# Patient Record
Sex: Female | Born: 1970
Health system: Southern US, Community
[De-identification: ages and names within clinical notes are randomized; demographics above are authoritative.]

## PROBLEM LIST (undated history)

## (undated) ENCOUNTER — Inpatient Hospital Stay (HOSPITAL_COMMUNITY): Admission: AD | Payer: 59 | Source: Ambulatory Visit | Admitting: Gynecology

## (undated) DIAGNOSIS — I10 Essential (primary) hypertension: Secondary | ICD-10-CM

## (undated) DIAGNOSIS — M5020 Other cervical disc displacement, unspecified cervical region: Secondary | ICD-10-CM

## (undated) HISTORY — PX: TUBAL LIGATION: SHX77

---

## 2003-12-03 ENCOUNTER — Emergency Department (HOSPITAL_COMMUNITY): Admission: EM | Admit: 2003-12-03 | Discharge: 2003-12-03 | Payer: Self-pay | Admitting: Emergency Medicine

## 2004-10-30 ENCOUNTER — Emergency Department (HOSPITAL_COMMUNITY): Admission: EM | Admit: 2004-10-30 | Discharge: 2004-10-30 | Payer: Self-pay | Admitting: Emergency Medicine

## 2005-01-31 ENCOUNTER — Emergency Department (HOSPITAL_COMMUNITY): Admission: EM | Admit: 2005-01-31 | Discharge: 2005-01-31 | Payer: Self-pay | Admitting: Family Medicine

## 2005-09-19 ENCOUNTER — Emergency Department (HOSPITAL_COMMUNITY): Admission: EM | Admit: 2005-09-19 | Discharge: 2005-09-19 | Payer: Self-pay | Admitting: *Deleted

## 2006-07-14 ENCOUNTER — Emergency Department (HOSPITAL_COMMUNITY): Admission: EM | Admit: 2006-07-14 | Discharge: 2006-07-14 | Payer: Self-pay | Admitting: Emergency Medicine

## 2007-12-04 ENCOUNTER — Emergency Department (HOSPITAL_COMMUNITY): Admission: EM | Admit: 2007-12-04 | Discharge: 2007-12-04 | Payer: Self-pay | Admitting: Emergency Medicine

## 2009-04-20 DIAGNOSIS — M5020 Other cervical disc displacement, unspecified cervical region: Secondary | ICD-10-CM

## 2009-04-20 HISTORY — DX: Other cervical disc displacement, unspecified cervical region: M50.20

## 2009-10-08 ENCOUNTER — Observation Stay (HOSPITAL_COMMUNITY): Admission: EM | Admit: 2009-10-08 | Discharge: 2009-10-09 | Payer: Self-pay | Admitting: Emergency Medicine

## 2009-10-09 ENCOUNTER — Encounter (INDEPENDENT_AMBULATORY_CARE_PROVIDER_SITE_OTHER): Payer: Self-pay | Admitting: Internal Medicine

## 2009-10-09 ENCOUNTER — Ambulatory Visit: Payer: Self-pay | Admitting: Vascular Surgery

## 2009-10-11 ENCOUNTER — Ambulatory Visit: Payer: Self-pay | Admitting: Internal Medicine

## 2009-10-11 DIAGNOSIS — M5020 Other cervical disc displacement, unspecified cervical region: Secondary | ICD-10-CM | POA: Insufficient documentation

## 2009-10-11 DIAGNOSIS — M5412 Radiculopathy, cervical region: Secondary | ICD-10-CM | POA: Insufficient documentation

## 2009-10-11 DIAGNOSIS — M542 Cervicalgia: Secondary | ICD-10-CM

## 2009-10-17 ENCOUNTER — Telehealth: Payer: Self-pay | Admitting: Internal Medicine

## 2009-12-05 ENCOUNTER — Encounter
Admission: RE | Admit: 2009-12-05 | Discharge: 2010-03-05 | Payer: Self-pay | Admitting: Physical Medicine & Rehabilitation

## 2009-12-10 ENCOUNTER — Encounter: Payer: Self-pay | Admitting: Internal Medicine

## 2009-12-10 ENCOUNTER — Other Ambulatory Visit: Admission: RE | Admit: 2009-12-10 | Discharge: 2009-12-10 | Payer: Self-pay | Admitting: Internal Medicine

## 2009-12-10 ENCOUNTER — Ambulatory Visit: Payer: Self-pay | Admitting: Internal Medicine

## 2009-12-10 DIAGNOSIS — R030 Elevated blood-pressure reading, without diagnosis of hypertension: Secondary | ICD-10-CM

## 2009-12-10 LAB — CONVERTED CEMR LAB
ALT: 15 units/L (ref 0–35)
Alkaline Phosphatase: 79 units/L (ref 39–117)
Basophils Absolute: 0.1 10*3/uL (ref 0.0–0.1)
Bilirubin Urine: NEGATIVE
Bilirubin, Direct: 0.1 mg/dL (ref 0.0–0.3)
CO2: 29 meq/L (ref 19–32)
Chloride: 103 meq/L (ref 96–112)
Cholesterol: 171 mg/dL (ref 0–200)
Eosinophils Absolute: 0.1 10*3/uL (ref 0.0–0.7)
GFR calc non Af Amer: 122.49 mL/min (ref >60)
Glucose, Bld: 91 mg/dL (ref 70–99)
HCT: 39.6 % (ref 36.0–46.0)
HDL: 54.6 mg/dL (ref >39.00)
Ketones, ur: NEGATIVE mg/dL
LDL Cholesterol: 102 mg/dL — ABNORMAL HIGH (ref 0–99)
Lymphocytes Relative: 27.6 % (ref 12.0–46.0)
Lymphs Abs: 2 10*3/uL (ref 0.7–4.0)
MCV: 91 fL (ref 78.0–100.0)
Monocytes Absolute: 0.6 10*3/uL (ref 0.1–1.0)
Neutro Abs: 4.6 10*3/uL (ref 1.4–7.7)
Platelets: 305 10*3/uL (ref 150.0–400.0)
Specific Gravity, Urine: >=1.03 (ref 1.000–1.030)
Total Bilirubin: 0.4 mg/dL (ref 0.3–1.2)
Total CHOL/HDL Ratio: 3
Total Protein, Urine: NEGATIVE mg/dL
Total Protein: 7.1 g/dL (ref 6.0–8.3)
Triglycerides: 71 mg/dL (ref 0.0–149.0)
Urine Glucose: NEGATIVE mg/dL
VLDL: 14.2 mg/dL (ref 0.0–40.0)

## 2009-12-11 ENCOUNTER — Encounter: Payer: Self-pay | Admitting: Internal Medicine

## 2009-12-12 ENCOUNTER — Encounter: Payer: Self-pay | Admitting: Internal Medicine

## 2009-12-13 ENCOUNTER — Ambulatory Visit: Payer: Self-pay | Admitting: Physical Medicine & Rehabilitation

## 2009-12-18 ENCOUNTER — Encounter
Admission: RE | Admit: 2009-12-18 | Discharge: 2010-02-05 | Payer: Self-pay | Admitting: Physical Medicine & Rehabilitation

## 2010-05-21 NOTE — Letter (Signed)
Summary: Results Follow-up Letter  [facility]  [address] [place], [place] [address]   Phone: [phone]  Fax: [phone]    12/12/2009  [address] [place], [place]  [address]  Dear Ms. Jasmine Franklin,   The following are the results of your recent test(s):  Test     Result     Pap Smear    Normal____xxx___  Not Normal_____       Comments:fungal infection _________________________________________________________ Cholesterol LDL(Bad cholesterol):          Your goal is less than:         HDL (Good cholesterol):        Your goal is more than: _________________________________________________________ Other Tests:   _________________________________________________________  Please call for an appointment in 2-3 weeks _________________________________________________________ _________________________________________________________ _________________________________________________________  Sincerely,  Sanda Linger MD [facility]

## 2010-05-21 NOTE — Assessment & Plan Note (Signed)
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  O2 Flow:  Room air CC: Hospital follow up// Lside arm w/ neck pain and stiffness, Neck pain Is Patient Diabetic? No   Primary Care Provider:  Etta Grandchild MD  CC:  Hospital follow up// Lside arm w/ neck pain and stiffness and Neck pain.  History of Present Illness:  Neck Pain      This is a 40 year old woman who presents with Neck pain.  The problem began 2 months ago.  The intensity is described as moderate-severe.  The patient reports left neck pain, but denies right neck pain, midline neck pain, bilateral neck pain, and left shoulder pain.  Associated symptoms include numbness, weakness, and tingling/parasthesias.  The patient denies the following associated symptoms: impaired coordination, gait disturbance, fever, bladder dysfunction, bowel dysfunction, locking, clicking, and impaired neck ROM.  The pain is described as sharp, intermittent, and radiates to the left arm.  Evaluation to date has included MRI of neck that showed HNP at C4-C6 with disc material that extends into the neural foramen.  Aleve has not helped the pain.  Preventive Screening-Counseling & Management  Alcohol-Tobacco     Smoking Status: never  Caffeine-Diet-Exercise     Does Patient Exercise: no      Drug Use:  no.    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  Physical Exam  General:  alert, well-developed, well-nourished, well-hydrated, appropriate dress, normal appearance, healthy-appearing, cooperative to examination, good hygiene, and overweight-appearing.   Head:  normocephalic, atraumatic, no abnormalities observed, and no abnormalities palpated.   Eyes:  vision grossly intact, pupils react to accomodation, and no nystagmus.   Mouth:  Oral mucosa and oropharynx without lesions or exudates.  Teeth in good repair. Neck:  supple, no masses, no thyromegaly, no thyroid nodules or tenderness, no JVD, no HJR, normal carotid upstroke, no carotid bruits, no cervical lymphadenopathy, and no neck tenderness.   Lungs:  normal respiratory effort, no intercostal retractions, no accessory muscle use, normal breath sounds, no dullness, and no fremitus.   Heart:  normal rate, regular rhythm, no murmur, no gallop, no rub, and no JVD.   Abdomen:  soft, non-tender, normal bowel sounds, no distention, no masses, no guarding, no rigidity, no rebound tenderness, no abdominal hernia, no inguinal hernia, no  hepatomegaly, and no splenomegaly.   Msk:  normal ROM, no joint tenderness, no joint swelling, no joint warmth, no redness over joints, no joint deformities, no joint instability, and no crepitation.   Pulses:  R and L carotid,radial,femoral,dorsalis pedis and posterior tibial pulses are full and equal bilaterally Extremities:  No clubbing, cyanosis, edema, or deformity  noted with normal full range of motion of all joints.   Neurologic:  alert & oriented X3, cranial nerves II-XII intact, sensation intact to light touch, sensation intact to pinprick, gait normal, DTRs symmetrical and normal, finger-to-nose normal, heel-to-shin normal, and LUE weakness.     Detailed Back/Spine Exam  General:    obese.    Gait:    Normal heel-toe gait pattern bilaterally.    Skin:    Intact with no erythema; no scarring.    Cervical Exam:  Inspection-deformity:    Normal Palpation-spinal tenderness:  Normal Range of Motion:    Forward Flexion:   55 degrees    Hyperextension:   80 degrees    Right Lat. Flexion:   55 degrees    Left Lat. Flexion:   15 degrees    Right Lat. Rotation:   85 degrees    Left Lat. Rotation:   45 degrees Spurling Maneuver:    negative   Impression & Recommendations:  Problem # 1:  HERNIATED CERVICAL DISC (ICD-722.0) Assessment New I think she would benefit from [facility] will try depo-medrol IM for inflammation around the disc herniation  Orders: Pain Clinic Referral (Pain)  Problem # 2:  NECK PAIN, LEFT (ICD-723.1) Assessment: New  Her updated medication list for this problem includes:    Amrix 15 Mg Xr24h-cap (Cyclobenzaprine hcl) ..... One by mouth once daily for neck pain    Tramadol Hcl 50 Mg Tabs (Tramadol hcl) .Marland Kitchen... 1-2 by mouth qid as needed for pain  Orders: Pain Clinic Referral (Pain)  Problem # 3:  CERVICAL RADICULOPATHY, LEFT (ICD-723.4) Assessment: New  Orders: Pain Clinic Referral (Pain)  Complete Medication List: 1)  Amrix 15 Mg Xr24h-cap (Cyclobenzaprine hcl) .... One by mouth once daily for neck pain 2)  Tramadol Hcl 50 Mg Tabs (Tramadol hcl) .Marland Kitchen.. 1-2 by mouth qid as needed for pain  Patient Instructions: 1)  Please schedule a follow-up appointment in 2 months. Prescriptions: TRAMADOL HCL 50 MG TABS (TRAMADOL HCL) 1-2 by mouth QID as needed for pain  #50 x 2   Entered and Authorized by:   Etta Grandchild MD   Signed by:   Etta Grandchild MD on 10/11/2009   Method used:   Print then Give to Patient   RxID:   [id number] AMRIX 15 MG XR24H-CAP (CYCLOBENZAPRINE HCL) One by mouth once daily for neck pain  #10 x 0   Entered and Authorized by:   Etta Grandchild MD   Signed by:   Etta Grandchild MD on 10/11/2009   Method used:   Samples Given   RxID:   [id number]   Preventive Care Screening  Last Tetanus Booster:    Date:  04/20/2004    Results:  Historical

## 2010-05-21 NOTE — Letter (Signed)
Summary: Out of Work  [facility]  [address] [place], [place] [address]   Phone: [phone]  Fax: [phone]      October 11, 2009   Employee:  ARMENIA SILVERIA    To Whom It May Concern:   For Medical reasons, please excuse the above named employee from work for the following dates:  Start:   10/09/09  End:   10/14/09  If you need additional information, please feel free to contact our office.         Sincerely,    Etta Grandchild MD

## 2010-05-21 NOTE — Letter (Signed)
Summary: Lipid Letter  [facility]  [address] [place], [place] [address]   Phone: [phone]  Fax: [phone]    12/11/2009  Jasmine Franklin [address] [place], [place]  [address]  Dear Jasmine Franklin:  We have carefully reviewed your last lipid profile from 12/10/2009 and the results are noted below with a summary of recommendations for lipid management.    Cholesterol:       171     Goal: <200   HDL "good" Cholesterol:   57.84     Goal: >50   LDL "bad" Cholesterol:   102     Goal: <130   Triglycerides:       71.0     Goal: <150    your cholesterol levels are very good and your other labs are normal    TLC Diet (Therapeutic Lifestyle Change): Saturated Fats & Transfatty acids should be kept < 7% of total calories ***Reduce Saturated Fats Polyunstaurated Fat can be up to 10% of total calories Monounsaturated Fat Fat can be up to 20% of total calories Total Fat should be no greater than 25-35% of total calories Carbohydrates should be 50-60% of total calories Protein should be approximately 15% of total calories Fiber should be at least 20-30 grams a day ***Increased fiber may help lower LDL Total Cholesterol should be < 200mg /day Consider adding plant stanol/sterols to diet (example: Benacol spread) ***A higher intake of unsaturated fat may reduce Triglycerides and Increase HDL    Adjunctive Measures (may lower LIPIDS and reduce risk of Heart Attack) include: Aerobic Exercise (20-30 minutes 3-4 times a week) Limit Alcohol Consumption Weight Reduction Aspirin 75-81 mg a day by mouth (if not allergic or contraindicated) Dietary Fiber 20-30 grams a day by mouth     Current Medications: 1)    Amrix 15 Mg Xr24h-cap (Cyclobenzaprine hcl) .... One by mouth once daily for neck pain 2)    Butrans 5 Mcg/hr Ptwk (Buprenorphine) .... Apply one each week  If you have any questions, please call. We appreciate being able to work with you.   Sincerely,    [facility] Etta Grandchild MD

## 2010-05-21 NOTE — Assessment & Plan Note (Signed)
Summary: 2 mo fu-oyu   Vital Signs:  Patient profile:   40 year old female Height:      67 inches Weight:      210 pounds BMI:     33.01 O2 Sat:      99 % on Room air Temp:     98.3 degrees F oral Pulse rate:   90 / minute Pulse rhythm:   regular Resp:     16 per minute BP sitting:   140 / 90  (left arm) Cuff size:   large  Vitals Entered By: Rock Nephew CMA (December 10, 2009 9:19 AM)  Nutrition Counseling: Patient's BMI is greater than 25 and therefore counseled on weight management options.  O2 Flow:  Room air CC: CPX with labs, Preventive Care Is Patient Diabetic? No Pain Assessment Patient in pain? no       Does patient need assistance? Functional Status Self care Ambulation Normal   Primary Care Provider:  Etta Grandchild MD  CC:  CPX with labs and Preventive Care.  History of Present Illness: She returns for f/up and she requests a complete physical. Her neck and left arm pain is much better. She has stopped using Amrix and Butrans but she plans to keep her appt. with Pain Mngt. this Friday.  Preventive Screening-Counseling & Management  Alcohol-Tobacco     Alcohol drinks/day: 0     Smoking Status: never  Caffeine-Diet-Exercise     Does Patient Exercise: yes     Exercise Counseling: to improve exercise regimen  Hep-HIV-STD-Contraception     Hepatitis Risk: no risk noted     HIV Risk: no risk noted     STD Risk: no risk noted     Dental Visit-last 6 months yes     Dental Care Counseling: to seek dental care; no dental care within six months     SBE monthly: yes     SBE Education/Counseling: to perform regular SBE  Safety-Violence-Falls     Seat Belt Use: yes     Helmet Use: yes     Firearms in the Home: no firearms in the home     Smoke Detectors: yes     Violence in the Home: no risk noted     Sexual Abuse: no      Sexual History:  currently monogamous.        Drug Use:  no.        Blood Transfusions:  no.    Current Medications  (verified): 1)  Amrix 15 Mg Xr24h-Cap (Cyclobenzaprine Hcl) .... One By Mouth Once Daily For Neck Pain 2)  Butrans 5 Mcg/hr Ptwk (Buprenorphine) .... 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 Unremarkable  Past Surgical History: Tubal ligation  Family History: Reviewed history from 10/11/2009 and no changes required. Family History Hypertension  Social History: Reviewed history from 10/11/2009 and no changes required. 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  The patient denies anorexia, fever, chest pain, syncope, dyspnea on exertion, peripheral edema, prolonged cough, headaches, hemoptysis, abdominal pain, melena, hematochezia, severe indigestion/heartburn, hematuria, suspicious skin lesions, difficulty walking, depression, enlarged lymph nodes, angioedema, and breast masses.    Physical Exam  General:  alert, well-developed, well-nourished, well-hydrated, appropriate dress, healthy-appearing, cooperative to examination, good hygiene, and overweight-appearing.   Head:  normocephalic, atraumatic, no abnormalities observed, and no abnormalities palpated.   Eyes:  vision grossly intact, pupils equal,  pupils round, pupils reactive to light, no injection, and no retinal abnormalitiies.   Ears:  R ear normal and L ear normal.   Mouth:  Oral mucosa and oropharynx without lesions or exudates.  Teeth in good repair. Neck:  supple, full ROM, no masses, no thyromegaly, no thyroid nodules or tenderness, no JVD, normal carotid upstroke, no carotid bruits, no cervical lymphadenopathy, and no neck tenderness.   Chest Wall:  no deformities, no tenderness, and no mass.   Breasts:  skin/areolae normal, no masses, no abnormal thickening, no nipple discharge, no tenderness, and no adenopathy.   Lungs:  normal respiratory effort, no intercostal retractions, no accessory muscle use, normal breath sounds, no dullness, no fremitus, no crackles, and no wheezes.   Heart:  normal rate, regular rhythm, no murmur, no gallop, no rub, and no JVD.   Abdomen:  soft, non-tender, normal bowel sounds, no distention, no masses, no guarding, no rigidity, no rebound tenderness, no abdominal hernia, no inguinal hernia, no hepatomegaly, and no splenomegaly.   Rectal:  No external abnormalities noted. Normal sphincter tone. No rectal masses or tenderness. Genitalia:  Normal introitus for age, no external lesions, no vaginal discharge, mucosa pink and moist, no vaginal or cervical lesions, no vaginal atrophy, no friaility or hemorrhage, normal uterus size and position, no adnexal masses or tenderness Msk:  No deformity or scoliosis noted of thoracic or lumbar spine.   Pulses:  R and L carotid,radial,femoral,dorsalis pedis and posterior tibial pulses are full and equal bilaterally Extremities:  No clubbing, cyanosis, edema, or deformity noted with normal full range of motion of all joints.   Neurologic:  No cranial nerve deficits noted. Station and gait are normal. Plantar reflexes are down-going bilaterally. DTRs are symmetrical throughout. Sensory, motor and coordinative functions appear intact. Skin:  turgor normal, color normal, no  rashes, no suspicious lesions, no ecchymoses, no petechiae, no purpura, no ulcerations, no edema, and tattoo(s).   Cervical Nodes:  no anterior cervical adenopathy and no posterior cervical adenopathy.   Axillary Nodes:  no R axillary adenopathy and no L axillary adenopathy.   Inguinal Nodes:  no R inguinal adenopathy and no L inguinal adenopathy.   Psych:  Cognition and judgment appear intact. Alert and cooperative with normal attention span and concentration. No apparent delusions, illusions, hallucinations   Impression & Recommendations:  Problem # 1:  ELEVATED BLOOD PRESSURE WITHOUT DIAGNOSIS OF HYPERTENSION (ICD-796.2) Assessment New  Orders: TLB-Lipid Panel (80061-LIPID) TLB-BMP (Basic Metabolic Panel-BMET) (80048-METABOL) TLB-CBC Platelet - w/Differential (85025-CBCD) TLB-Hepatic/Liver Function Pnl (80076-HEPATIC) TLB-TSH (Thyroid Stimulating Hormone) (84443-TSH) TLB-Udip w/ Micro (81001-URINE)  BP today: 140/90 Prior BP: 136/90 (10/11/2009)  Instructed in low sodium diet (DASH Handout) and behavior modification.    Problem # 2:  ROUTINE GENERAL MEDICAL EXAM@HEALTH  CARE FACL (ICD-V70.0) Assessment: New  Orders: TLB-Lipid Panel (80061-LIPID) TLB-BMP (Basic Metabolic Panel-BMET) (80048-METABOL) TLB-CBC Platelet -  w/Differential (85025-CBCD) TLB-Hepatic/Liver Function Pnl (80076-HEPATIC) TLB-TSH (Thyroid Stimulating Hormone) (84443-TSH) TLB-Udip w/ Micro (81001-URINE) Radiology Referral (Radiology)  Td Booster: Historical (04/20/2004)    Discussed using sunscreen, use of alcohol, drug use, self breast exam, routine dental care, routine eye care, schedule for GYN exam, routine physical exam, seat belts, multiple vitamins, osteoporosis prevention, adequate calcium intake in diet, recommendations for immunizations, mammograms and Pap smears.  Discussed exercise and checking cholesterol.  Discussed gun safety, safe sex, and contraception.  Problem # 3:  HERNIATED CERVICAL DISC  (ICD-722.0) Assessment: Improved  Complete Medication List: 1)  Amrix 15 Mg Xr24h-cap (Cyclobenzaprine hcl) .... One by mouth once daily for neck pain 2)  Butrans 5 Mcg/hr Ptwk (Buprenorphine) .... Apply one each week  Colorectal Screening:  Current Recommendations:    Hemoccult: NEG X 1 today  PAP Screening:    Hx Cervical Dysplasia in last 5 yrs? No    3 normal PAP smears in last 5 yrs? Yes    Reviewed PAP smear recommendations:  PAP smear done  Mammogram Screening:    Reviewed Mammogram recommendations:  mammogram ordered  Osteoporosis Risk Assessment:  Risk Factors for Fracture or Low Bone Density:   Smoking status:       never  Immunization & Chemoprophylaxis:    Tetanus vaccine: Historical  (04/20/2004)  Patient Instructions: 1)  It is important that you exercise regularly at least 20 minutes 5 times a week. If you develop chest pain, have severe difficulty breathing, or feel very tired , stop exercising immediately and seek medical attention. 2)  You need to lose weight. Consider a lower calorie diet and regular exercise.  3)  Schedule your mammogram. 4)  You need to have a Pap Smear to prevent cervical cancer. 5)  If you could be exposed to sexually transmitted diseases, you should use a condom. 6)  If you are having sex and you or your partner don't want a child, use contraception. 7)  Check your Blood Pressure regularly. If it is above 140/90: you should make an appointment.

## 2010-05-21 NOTE — Progress Notes (Signed)
Summary: Pain  Phone Note Call from Patient Call back at [facility] Phone [phone]   Summary of Call: Pt was given amrix and tramadol. Tramadol causes her to be sleepy. Patient is requesting rx to take during the day while at work. C/o pain and tingling while at work. Any other suggestions while she is waiting on pain clinic apt 8/26?  Initial call taken by: Lamar Sprinkles, CMA,  October 17, 2009 2:49 PM  Follow-up for Phone Call        Can use amrix and tramadol along w/this med?  Follow-up by: Lamar Sprinkles, CMA,  October 17, 2009 3:07 PM  Additional Follow-up for Phone Call Additional follow up Details #1::        no,  stop tramadol Additional Follow-up by: Etta Grandchild MD,  October 17, 2009 3:09 PM    Additional Follow-up for Phone Call Additional follow up Details #2::    left mess to call office back. Rx is up front...............Marland KitchenLamar Sprinkles, CMA  October 17, 2009 5:07 PM   Additional Follow-up for Phone Call Additional follow up Details #3:: Details for Additional Follow-up Action Taken: Pt informed  Additional Follow-up by: Lamar Sprinkles, CMA,  October 18, 2009 9:50 AM  New/Updated Medications: BUTRANS 5 MCG/HR PTWK (BUPRENORPHINE) Apply one each week Prescriptions: AMRIX 15 MG XR24H-CAP (CYCLOBENZAPRINE HCL) One by mouth once daily for neck pain  #30 x 0   Entered by:   Lamar Sprinkles, CMA   Authorized by:   Etta Grandchild MD   Signed by:   Lamar Sprinkles, CMA on 10/18/2009   Method used:   Electronically to        [facility] [address] [phone]* (retail)       [address]       [place], [place]  [address]       Ph: [phone] or [phone]       Fax: [phone]   RxID:   [id number] BUTRANS 5 MCG/HR PTWK (BUPRENORPHINE) Apply one each week  #4 x 3   Entered and Authorized by:   Etta Grandchild MD   Signed by:   Etta Grandchild MD on 10/17/2009   Method used:   Print then Give to Patient   RxID:   [id number]

## 2010-07-06 LAB — POCT I-STAT, CHEM 8
Calcium, Ion: 1.16 mmol/L (ref 1.12–1.32)
Chloride: 103 mEq/L (ref 96–112)
Creatinine, Ser: 0.9 mg/dL (ref 0.4–1.2)
HCT: 40 % (ref 36.0–46.0)
Potassium: 3.6 mEq/L (ref 3.5–5.1)
Sodium: 139 mEq/L (ref 135–145)
TCO2: 25 mmol/L (ref 0–100)

## 2010-07-06 LAB — COMPREHENSIVE METABOLIC PANEL
ALT: 15 U/L (ref 0–35)
AST: 19 U/L (ref 0–37)
Albumin: 3.1 g/dL — ABNORMAL LOW (ref 3.5–5.2)
CO2: 27 mEq/L (ref 19–32)
Chloride: 106 mEq/L (ref 96–112)
GFR calc non Af Amer: >60 mL/min (ref >60)
Potassium: 3.6 mEq/L (ref 3.5–5.1)
Total Bilirubin: 0.5 mg/dL (ref 0.3–1.2)
Total Protein: 6.7 g/dL (ref 6.0–8.3)

## 2010-07-06 LAB — CBC
HCT: 37.3 % (ref 36.0–46.0)
HCT: 38 % (ref 36.0–46.0)
Hemoglobin: 12.3 g/dL (ref 12.0–15.0)
Hemoglobin: 12.5 g/dL (ref 12.0–15.0)
MCV: 90.2 fL (ref 78.0–100.0)
Platelets: 255 10*3/uL (ref 150–400)
RDW: 12.6 % (ref 11.5–15.5)
WBC: 7.4 10*3/uL (ref 4.0–10.5)
WBC: 7.7 10*3/uL (ref 4.0–10.5)

## 2010-07-06 LAB — DIFFERENTIAL
Basophils Relative: 0 % (ref 0–1)
Lymphocytes Relative: 36 % (ref 12–46)
Lymphs Abs: 2.7 10*3/uL (ref 0.7–4.0)
Neutro Abs: 4.1 10*3/uL (ref 1.7–7.7)
Neutrophils Relative %: 55 % (ref 43–77)

## 2010-07-06 LAB — LIPID PANEL
Cholesterol: 144 mg/dL (ref 0–200)
HDL: 46 mg/dL (ref >39)
Total CHOL/HDL Ratio: 3.1 RATIO
VLDL: 11 mg/dL (ref 0–40)

## 2010-07-06 LAB — HOMOCYSTEINE: Homocysteine: 7.8 umol/L (ref 4.0–15.4)

## 2010-07-06 LAB — CK TOTAL AND CKMB (NOT AT ARMC)
CK, MB: 0.5 ng/mL (ref 0.3–4.0)
Relative Index: INVALID (ref 0.0–2.5)
Total CK: 83 U/L (ref 7–177)

## 2010-07-06 LAB — PROTIME-INR: Prothrombin Time: 15.4 seconds — ABNORMAL HIGH (ref 11.6–15.2)

## 2011-11-12 ENCOUNTER — Emergency Department (INDEPENDENT_AMBULATORY_CARE_PROVIDER_SITE_OTHER)
Admission: EM | Admit: 2011-11-12 | Discharge: 2011-11-12 | Disposition: A | Discharge status: Home / Self Care | Payer: 59 | Source: Home / Self Care

## 2011-11-12 ENCOUNTER — Encounter (HOSPITAL_COMMUNITY): Payer: Self-pay | Admitting: *Deleted

## 2011-11-12 DIAGNOSIS — R030 Elevated blood-pressure reading, without diagnosis of hypertension: Secondary | ICD-10-CM

## 2011-11-12 DIAGNOSIS — R51 Headache: Secondary | ICD-10-CM

## 2011-11-12 MED ORDER — HYDROCHLOROTHIAZIDE 25 MG PO TABS: 25.0000 mg | ORAL_TABLET | Freq: Every day | ORAL | Status: DC

## 2011-11-12 MED ORDER — ONDANSETRON HCL 4 MG PO TABS
4.0000 mg | ORAL_TABLET | Freq: Once | ORAL | Status: AC
  Administered 2011-11-12: 4 mg via ORAL

## 2011-11-12 MED ORDER — ONDANSETRON 4 MG PO TBDP
ORAL_TABLET | ORAL | Status: AC
  Filled 2011-11-12: qty 1

## 2011-11-12 MED ORDER — KETOROLAC TROMETHAMINE 30 MG/ML IJ SOLN: 30.0000 mg | Freq: Once | INTRAMUSCULAR | Status: DC

## 2011-11-12 MED ORDER — KETOROLAC TROMETHAMINE 30 MG/ML IJ SOLN
INTRAMUSCULAR | Status: AC
  Filled 2011-11-12: qty 1

## 2011-11-12 NOTE — ED Provider Notes (Signed)
Medical screening examination/treatment/procedure(s) were performed by resident-physician practitioner and as supervising physician I was immediately available for consultation/collaboration. I have examined the patient and review discharge instructions with the patient. I have examined the patient, and discussed her discharge plan. Patient feels much improved and has no focal neurological findings. Patient was instructed about symptoms that would warrant further evaluation in the emergency department.  Raynald Blend, MD 11/12/11 2147

## 2011-11-12 NOTE — ED Notes (Signed)
Pt  Reports  Has  A  Headache   With  Nausea  And   Photophobia      -  She  Reports  Had  Headache  yest  -  Worse  Today  About  4  Hours  agop  -  Symptoms  Not releived  By  Motrin       Awake  And  Alert  pearla

## 2011-11-12 NOTE — ED Provider Notes (Signed)
History     CSN: [id number]  Arrival date & time 11/12/11  1519   First MD Initiated Contact with Patient 11/12/11 1544      Chief Complaint  Patient presents with  . Headache    HPI  [facility] and nausea.  Headache started today at noon.  Located forehead and radiates to LT temporal lobe.  Pain is constant and throbbing.  She has a hx of chronic headaches, but she says her HA today is debilitating.  HA is associated with tension in neck and nausea, but no vomiting.  She denies any blurry vision today, but bright lights and loud noises make it HA worse.  Patient has taken Motrin 800 mg at 1:30 PM with little relief, however pain has improved while sitting in our office.   Blood pressure is elevated in our office today 168/100.  She does not have a hx of HTN.  Patient goes to [facility] and says her BP is usually normal.  Patient endorses HA and nausea, but denies any unilateral weakness, changes in vision, or slurred speech.  History reviewed. No pertinent past medical history.  History reviewed. No pertinent past surgical history.  No family history on file.  History  Substance Use Topics  . Smoking status: Not on file  . Smokeless tobacco: Not on file  . Alcohol Use: Not on file    Review of Systems  Per HPI  Allergies  Review of patient's allergies indicates no known allergies.  Home Medications   Current Outpatient Rx  Name Route Sig Dispense Refill  . MOTRIN IB PO Oral Take by mouth.      BP 168/100  Pulse 88  Temp 98.1 F (36.7 C) (Oral)  Resp 20  SpO2 100%  LMP 10/23/2011  Physical Exam  Constitutional: No distress.  HENT:  Head: Normocephalic and atraumatic.  Mouth/Throat: Oropharynx is clear and moist.  Eyes: Conjunctivae and EOM are normal. Pupils are equal, round, and reactive to light.  Neck: Normal range of motion. Neck supple.       Tenderness on palpation of paraspinal muscles     Pulmonary/Chest: Effort normal.  Neurological: She is alert. No cranial nerve deficit.       5/5 strength in all 4 extremities; no sensory deficits; Negative Romberg    ED Course  Procedures (including critical care time)  Labs Reviewed - No data to display No results found.   1. Headache   2. ELEVATED BLOOD PRESSURE WITHOUT DIAGNOSIS OF HYPERTENSION      MDM   Headache: likely secondary to migraine vs. Tension headache vs. Elevated BP.  Will treat pain with Toradol 30 IM and Zofran 4 mg.  Pain improved after treatments.  Advised patient to apply heating pads to neck and take Motrin as needed for headache.  If HA recurs and is associated with blurry vision, vomiting, or weakness, patient to return to ED.  Red flags reviewed per Discharge Instructions.  Elevated BP: Patient's BP 168/100 which could be secondary to pain.  Repeat BP after pain medication: 160/100.  Will give Rx for HCTZ 25 mg daily.  Patient to follow up with PCP in 2 days to recheck BP.  If elevated BP is associated with unilateral weakness, blurry vision, slurred speech, worsening HA, or numbness of extremities, patient to return to ED.         Barnabas Lister, MD 11/12/11 [phone]

## 2012-06-27 ENCOUNTER — Encounter (HOSPITAL_COMMUNITY): Payer: Self-pay

## 2012-06-27 DIAGNOSIS — R110 Nausea: Secondary | ICD-10-CM | POA: Insufficient documentation

## 2012-06-27 DIAGNOSIS — N83209 Unspecified ovarian cyst, unspecified side: Secondary | ICD-10-CM | POA: Insufficient documentation

## 2012-06-27 DIAGNOSIS — M545 Low back pain, unspecified: Secondary | ICD-10-CM | POA: Insufficient documentation

## 2012-06-27 DIAGNOSIS — Z79899 Other long term (current) drug therapy: Secondary | ICD-10-CM | POA: Insufficient documentation

## 2012-06-27 DIAGNOSIS — Z9889 Other specified postprocedural states: Secondary | ICD-10-CM | POA: Insufficient documentation

## 2012-06-27 DIAGNOSIS — Z3202 Encounter for pregnancy test, result negative: Secondary | ICD-10-CM | POA: Insufficient documentation

## 2012-06-27 LAB — URINALYSIS, MICROSCOPIC ONLY
Specific Gravity, Urine: 1.038 — ABNORMAL HIGH (ref 1.005–1.030)
pH: 6 (ref 5.0–8.0)

## 2012-06-27 NOTE — ED Notes (Signed)
Patient presents with right sided flank pain and lower back pain x 1 day. Denies urinary sx (hematuria, dysuria, frequency, urgency, discharge). No n/v now but did have some nausea yesterday

## 2012-06-28 ENCOUNTER — Encounter (HOSPITAL_COMMUNITY): Payer: Self-pay | Admitting: Emergency Medicine

## 2012-06-28 ENCOUNTER — Emergency Department (HOSPITAL_COMMUNITY): Payer: 59

## 2012-06-28 ENCOUNTER — Emergency Department (HOSPITAL_COMMUNITY)
Admission: EM | Admit: 2012-06-28 | Discharge: 2012-06-28 | Disposition: A | Discharge status: Home / Self Care | Payer: 59 | Attending: Emergency Medicine | Admitting: Emergency Medicine

## 2012-06-28 DIAGNOSIS — N949 Unspecified condition associated with female genital organs and menstrual cycle: Secondary | ICD-10-CM

## 2012-06-28 HISTORY — DX: Other cervical disc displacement, unspecified cervical region: M50.20

## 2012-06-28 LAB — POCT I-STAT, CHEM 8
BUN: 10 mg/dL (ref 6–23)
Calcium, Ion: 1.21 mmol/L (ref 1.12–1.23)
Chloride: 103 mEq/L (ref 96–112)
Creatinine, Ser: 0.6 mg/dL (ref 0.50–1.10)
Glucose, Bld: 106 mg/dL — ABNORMAL HIGH (ref 70–99)
TCO2: 28 mmol/L (ref 0–100)

## 2012-06-28 LAB — CBC WITH DIFFERENTIAL/PLATELET
Basophils Absolute: 0 10*3/uL (ref 0.0–0.1)
Eosinophils Absolute: 0.1 10*3/uL (ref 0.0–0.7)
Lymphocytes Relative: 29 % (ref 12–46)
Lymphs Abs: 2.6 10*3/uL (ref 0.7–4.0)
MCH: 30.7 pg (ref 26.0–34.0)
Neutrophils Relative %: 63 % (ref 43–77)
Platelets: 319 10*3/uL (ref 150–400)
RBC: 4.46 MIL/uL (ref 3.87–5.11)
RDW: 12.3 % (ref 11.5–15.5)
WBC: 9.2 10*3/uL (ref 4.0–10.5)

## 2012-06-28 LAB — COMPREHENSIVE METABOLIC PANEL
ALT: 13 U/L (ref 0–35)
AST: 16 U/L (ref 0–37)
Alkaline Phosphatase: 92 U/L (ref 39–117)
GFR calc Af Amer: >90 mL/min (ref >90)
Glucose, Bld: 106 mg/dL — ABNORMAL HIGH (ref 70–99)
Potassium: 3.6 mEq/L (ref 3.5–5.1)
Sodium: 138 mEq/L (ref 135–145)
Total Protein: 7.6 g/dL (ref 6.0–8.3)

## 2012-06-28 MED ORDER — ONDANSETRON HCL 4 MG/2ML IJ SOLN
4.0000 mg | Freq: Once | INTRAMUSCULAR | Status: AC
  Administered 2012-06-28: 4 mg via INTRAVENOUS
  Filled 2012-06-28: qty 2

## 2012-06-28 MED ORDER — SODIUM CHLORIDE 0.9 % IV BOLUS (SEPSIS)
1000.0000 mL | Freq: Once | INTRAVENOUS | Status: AC
  Administered 2012-06-28: 1000 mL via INTRAVENOUS

## 2012-06-28 MED ORDER — KETOROLAC TROMETHAMINE 30 MG/ML IJ SOLN
30.0000 mg | Freq: Once | INTRAMUSCULAR | Status: AC
  Administered 2012-06-28: 30 mg via INTRAVENOUS
  Filled 2012-06-28: qty 1

## 2012-06-28 MED ORDER — HYDROCODONE-ACETAMINOPHEN 5-325 MG PO TABS: 2.0000 | ORAL_TABLET | ORAL | Status: DC | PRN

## 2012-06-28 MED ORDER — IBUPROFEN 800 MG PO TABS: 800.0000 mg | ORAL_TABLET | Freq: Three times a day (TID) | ORAL | Status: DC

## 2012-06-28 MED ORDER — HYDROMORPHONE HCL PF 1 MG/ML IJ SOLN
1.0000 mg | Freq: Once | INTRAMUSCULAR | Status: AC
  Administered 2012-06-28: 1 mg via INTRAVENOUS
  Filled 2012-06-28: qty 1

## 2012-06-28 NOTE — ED Notes (Signed)
CT paged, pt ready for scan

## 2012-06-28 NOTE — ED Notes (Signed)
Pt transported to CT ?

## 2012-06-28 NOTE — ED Provider Notes (Signed)
History     CSN: [id number]  Arrival date & time 06/27/12  2217   First MD Initiated Contact with Patient 06/28/12 0139      Chief Complaint  Patient presents with  . Flank Pain    (Consider location/radiation/quality/duration/timing/severity/associated sxs/prior treatment) HPI History provided by patient. Right flank pain and lower back pain onset yesterday worse today. Pain seems to be radiating from right lower back. LMP 2 weeks ago on time and normal. No vaginal bleeding or discharge otherwise. No fevers or chills. Some nausea but no vomiting. Pain is moderate in severity and sharp in quality. No trauma. No history of same. No shortness of breath or chest pain. Past Medical History  Diagnosis Date  . Slipped cervical disc 2011    Past Surgical History  Procedure Laterality Date  . Cesarean section      No family history on file.  History  Substance Use Topics  . Smoking status: Never Smoker   . Smokeless tobacco: Never Used  . Alcohol Use: No    OB History   Grav Para Term Preterm Abortions TAB SAB Ect Mult Living                  Review of Systems  Constitutional: Negative for fever and chills.  HENT: Negative for neck pain and neck stiffness.   Eyes: Negative for pain.  Respiratory: Negative for shortness of breath.   Cardiovascular: Negative for chest pain.  Gastrointestinal: Negative for abdominal pain.  Genitourinary: Positive for flank pain. Negative for dysuria.  Musculoskeletal: Positive for back pain.  Skin: Negative for rash.  Neurological: Negative for headaches.  All other systems reviewed and are negative.    Allergies  Review of patient's allergies indicates no known allergies.  Home Medications   Current Outpatient Rx  Name  Route  Sig  Dispense  Refill  . Ibuprofen (MOTRIN IB PO)   Oral   Take by mouth.         Marland Kitchen HYDROcodone-acetaminophen (NORCO/VICODIN) 5-325 MG per tablet   Oral   Take 2 tablets by mouth every 4 (four)  hours as needed for pain.   15 tablet   0   . ibuprofen (ADVIL,MOTRIN) 800 MG tablet   Oral   Take 1 tablet (800 mg total) by mouth 3 (three) times daily.   21 tablet   0     BP 150/87  Pulse 80  Temp(Src) 98.6 F (37 C) (Oral)  Resp 18  SpO2 96%  LMP 06/13/2012  Physical Exam  Constitutional: She is oriented to person, place, and time. She appears well-developed and well-nourished.  HENT:  Head: Normocephalic and atraumatic.  Eyes: EOM are normal. Pupils are equal, round, and reactive to light.  Neck: Neck supple.  Cardiovascular: Normal rate, regular rhythm and intact distal pulses.   Pulmonary/Chest: Effort normal and breath sounds normal. No respiratory distress.  Abdominal: Soft. Bowel sounds are normal. She exhibits no distension. There is no tenderness. There is no rebound and no guarding.  Localizes discomfort to right flank without reproducible tenderness. No CVA tenderness. Negative Murphy sign.  Musculoskeletal: Normal range of motion. She exhibits no edema.  Neurological: She is alert and oriented to person, place, and time.  Skin: Skin is warm and dry.    ED Course  Procedures (including critical care time)  Results for orders placed during the hospital encounter of 06/28/12  URINALYSIS, MICROSCOPIC ONLY      Result Value Range   Color, Urine  YELLOW  YELLOW   APPearance CLEAR  CLEAR   Specific Gravity, Urine 1.038 (*) 1.005 - 1.030   pH 6.0  5.0 - 8.0   Glucose, UA NEGATIVE  NEGATIVE mg/dL   Hgb urine dipstick SMALL (*) NEGATIVE   Bilirubin Urine SMALL (*) NEGATIVE   Ketones, ur 15 (*) NEGATIVE mg/dL   Protein, ur NEGATIVE  NEGATIVE mg/dL   Urobilinogen, UA 1.0  0.0 - 1.0 mg/dL   Nitrite NEGATIVE  NEGATIVE   Leukocytes, UA NEGATIVE  NEGATIVE   RBC / HPF 3-6  <3 RBC/hpf   Bacteria, UA RARE  RARE   Squamous Epithelial / LPF FEW (*) RARE  CBC WITH DIFFERENTIAL      Result Value Range   WBC 9.2  4.0 - 10.5 K/uL   RBC 4.46  3.87 - 5.11 MIL/uL    Hemoglobin 13.7  12.0 - 15.0 g/dL   HCT 16.1  09.6 - 04.5 %   MCV 85.7  78.0 - 100.0 fL   MCH 30.7  26.0 - 34.0 pg   MCHC 35.9  30.0 - 36.0 g/dL   RDW 40.9  81.1 - 91.4 %   Platelets 319  150 - 400 K/uL   Neutrophils Relative 63  43 - 77 %   Neutro Abs 5.8  1.7 - 7.7 K/uL   Lymphocytes Relative 29  12 - 46 %   Lymphs Abs 2.6  0.7 - 4.0 K/uL   Monocytes Relative 7  3 - 12 %   Monocytes Absolute 0.7  0.1 - 1.0 K/uL   Eosinophils Relative 1  0 - 5 %   Eosinophils Absolute 0.1  0.0 - 0.7 K/uL   Basophils Relative 0  0 - 1 %   Basophils Absolute 0.0  0.0 - 0.1 K/uL  COMPREHENSIVE METABOLIC PANEL      Result Value Range   Sodium 138  135 - 145 mEq/L   Potassium 3.6  3.5 - 5.1 mEq/L   Chloride 103  96 - 112 mEq/L   CO2 25  19 - 32 mEq/L   Glucose, Bld 106 (*) 70 - 99 mg/dL   BUN 11  6 - 23 mg/dL   Creatinine, Ser 7.82  0.50 - 1.10 mg/dL   Calcium 9.5  8.4 - 95.6 mg/dL   Total Protein 7.6  6.0 - 8.3 g/dL   Albumin 3.7  3.5 - 5.2 g/dL   AST 16  0 - 37 U/L   ALT 13  0 - 35 U/L   Alkaline Phosphatase 92  39 - 117 U/L   Total Bilirubin 0.2 (*) 0.3 - 1.2 mg/dL   GFR calc non Af Amer >90  >90 mL/min   GFR calc Af Amer >90  >90 mL/min  POCT PREGNANCY, URINE      Result Value Range   Preg Test, Ur NEGATIVE  NEGATIVE  POCT I-STAT, CHEM 8      Result Value Range   Sodium 141  135 - 145 mEq/L   Potassium 3.5  3.5 - 5.1 mEq/L   Chloride 103  96 - 112 mEq/L   BUN 10  6 - 23 mg/dL   Creatinine, Ser 2.13  0.50 - 1.10 mg/dL   Glucose, Bld 086 (*) 70 - 99 mg/dL   Calcium, Ion 5.78  4.69 - 1.23 mmol/L   TCO2 28  0 - 100 mmol/L   Hemoglobin 13.9  12.0 - 15.0 g/dL   HCT 62.9  52.8 - 41.3 %  Ct Abdomen Pelvis Wo Contrast  06/28/2012  *RADIOLOGY REPORT*  Clinical Data: Right flank and back pain  CT ABDOMEN AND PELVIS WITHOUT CONTRAST  Technique:  Multidetector CT imaging of the abdomen and pelvis was performed following the standard protocol without intravenous contrast.  Comparison: None.   Findings: Limited images through the lung bases demonstrate no significant appreciable abnormality. The heart size is within normal limits. No pleural or pericardial effusion.  Organ abnormality/lesion detection is limited in the absence of intravenous contrast. Within this limitation, left hepatic lobe cyst.  Unremarkable biliary system, spleen, pancreas, adrenal glands.  Symmetric renal size.  No hydronephrosis or hydroureter.  Unable to follow the ureteral course in their entirety due to the decompressed state.  No definite ureteral stones.  No CT evidence for colitis.  Normal appendix.  Small bowel loops are normal course and caliber.  No free intraperitoneal air or fluid.  No lymphadenopathy.  Normal caliber aorta and branch vessels.  Nonspecific right adnexal cyst measuring up to seven centimeters. Partially decompressed bladder.  No acute osseous finding.  Multilevel degenerative change/facet arthropathy, mild.  IMPRESSION: No acute abdominopelvic process identified.  No hydronephrosis or hydroureter. Unable to follow the ureters in their entirety due to decompressed state.  7 cm right adnexal cyst.  Recommend a 4-6 week pelvic ultrasound follow-up to document resolution.   Original Report Authenticated By: Jearld Lesch, M.D.     1. Adnexal cyst     IV fluids. IV Dilaudid. IV Zofran.  5:30 AM - recheck, feeling much better with pain resolved. Abdominal exam remains benign. Patient feels comfortable for discharge home with plan followup OB/GYN. Strict return precautions verbalizes understood. Presentation does not suggest ovarian torsion.   MDM  Flank pain evaluated with CT scan reviewed as above. CT results shared with patient - she understands need for outpatient followup  Urinalysis and labs obtained and reviewed  Pain improved with IV fluids and IV narcotics  Vital signs and nursing notes reviewed    Sunnie Nielsen, MD 06/28/12 [phone]

## 2012-06-28 NOTE — ED Notes (Signed)
Pt IV infiltrated

## 2012-06-28 NOTE — ED Notes (Signed)
Primary RN attempted IV x3. Unsuccessful. 2nd RN at bedside attempting access.

## 2012-07-04 ENCOUNTER — Ambulatory Visit: Payer: 59 | Admitting: Gynecology

## 2012-07-04 ENCOUNTER — Encounter: Payer: Self-pay | Admitting: Gynecology

## 2012-07-04 VITALS — BP 132/86 | Ht 64.0 in | Wt 198.0 lb

## 2012-07-04 DIAGNOSIS — N83201 Unspecified ovarian cyst, right side: Secondary | ICD-10-CM | POA: Insufficient documentation

## 2012-07-04 DIAGNOSIS — N949 Unspecified condition associated with female genital organs and menstrual cycle: Secondary | ICD-10-CM

## 2012-07-04 DIAGNOSIS — R1901 Right upper quadrant abdominal swelling, mass and lump: Secondary | ICD-10-CM

## 2012-07-04 MED ORDER — TRAMADOL HCL 50 MG PO TABS: 50.0000 mg | ORAL_TABLET | Freq: Four times a day (QID) | ORAL | Status: DC | PRN

## 2012-07-04 NOTE — Patient Instructions (Addendum)
Transvaginal Ultrasound Transvaginal ultrasound is a pelvic ultrasound, using a metal probe that is placed in the vagina, to look at a women's female organs. Transvaginal ultrasound is a method of seeing inside the pelvis of a woman. The ultrasound machine sends out sound waves from the transducer (probe). These sound waves bounce off body structures (like an echo) to create a picture. The picture shows up on a monitor. It is called transvaginal because the probe is inserted into the vagina. There should be very little discomfort from the vaginal probe. This test can also be used during pregnancy. Endovaginal ultrasound is another name for a transvaginal ultrasound. In a transabdominal ultrasound, the probe is placed on the outside of the belly. This method gives pictures that are lower quality than pictures from the transvaginal technique. Transvaginal ultrasound is used to look for problems of the female genital tract. Some such problems include:  Infertility problems.  Congenital (birth defect) malformations of the uterus and ovaries.  Tumors in the uterus.  Abnormal bleeding.  Ovarian tumors and cysts.  Abscess (inflamed tissue around pus) in the pelvis.  Unexplained abdominal or pelvic pain.  Pelvic infection. DURING PREGNANCY, TRANSVAGINAL ULTRASOUND MAY BE USED TO LOOK AT:  Normal pregnancy.  Ectopic pregnancy (pregnancy outside the uterus).  Fetal heartbeat.  Abnormalities in the pelvis, that are not seen well with transabdominal ultrasound.  Suspected twins or multiples.  Impending miscarriage.  Problems with the cervix (incompetent cervix, not able to stay closed and hold the baby).  When doing an amniocentesis (removing fluid from the pregnancy sac, for testing).  Looking for abnormalities of the baby.  Checking the growth, development, and age of the fetus.  Measuring the amount of fluid in the amniotic sac.  When doing an external version of the baby (moving  baby into correct position).  Evaluating the baby for problems in high risk pregnancies (biophysical profile).  Suspected fetal demise (death). Sometimes a special ultrasound method called Saline Infusion Sonography (SIS) is used for a more accurate look at the uterus. Sterile saline (salt water) is injected into the uterus of non-pregnant patients to see the inside of the uterus better. SIS is not used on pregnant women. The vaginal probe can also assist in obtaining biopsies of abnormal areas, in draining fluid from cysts on the ovary, and in finding IUDs (intrauterine device, birth control) that cannot be located. PREPARATION FOR TEST A transvaginal ultrasound is done with the bladder empty. The transabdominal ultrasound is done with your bladder full. You may be asked to drink several glasses of water before that exam. Sometimes, a transabdominal ultrasound is done just after a transvaginal ultrasound, to look at organs in your abdomen. PROCEDURE  You will lie down on a table, with your knees bent and your feet in foot holders. The probe is covered with a condom. A sterile lubricant is put into the vagina and on the probe. The lubricant helps transmit the sound waves and avoid irritating the vagina. Your caregiver will move the probe inside the vaginal cavity to scan the pelvic structures. A normal test will show a normal pelvis and normal contents. An abnormal test will show abnormalities of the pelvis, placenta, or baby. ABNORMAL RESULTS MAY BE DUE TO:  Growths or tumors in the:  Uterus.  Ovaries.  Vagina.  Other pelvic structures.  Non-cancerous growths of the uterus and ovaries.  Twisting of the ovary, cutting off blood supply to the ovary (ovarian torsion).  Areas of infection, including:  Pelvic  inflammatory disease.  Abscess in the pelvis.  Locating an IUD. PROBLEMS FOUND IN PREGNANT WOMEN MAY INCLUDE:  Ectopic pregnancy (pregnancy outside the uterus).  Multiple  pregnancies.  Early dilation (opening) of the cervix. This may indicate an incompetent cervix and early delivery.  Impending miscarriage.  Fetal death.  Problems with the placenta, including:  Placenta has grown over the opening of the womb (placenta previa).  Placenta has separated early in the womb (placental abruption).  Placenta grows into the muscle of the uterus (placenta accreta).  Tumors of pregnancy, including gestational trophoblastic disease. This is an abnormal pregnancy, with no fetus. The uterus is filled with many grape-like cysts that could sometimes be cancerous.  Incorrect position of the fetus (breech, vertex).  Intrauterine fetal growth retardation (IUGR) (poor growth in the womb).  Fetal abnormalities or infection. RISKS AND COMPLICATIONS There are no known risks to the ultrasound procedure. There is no X-ray used when doing an ultrasound. Document Released: 03/18/2004 Document Revised: 06/29/2011 Document Reviewed: 03/06/2009 [facility] Patient Information 2013 [place], [place].  CA-125 Tumor Marker CA 125 is a tumor marker that is used to help monitor the course of ovarian or endometrial cancer. PREPARATION FOR TEST No preparation is necessary. NORMAL FINDINGS Adults: 0-35 units/mL (0-35 kilounits)/L Ranges for normal findings may vary among different laboratories and hospitals. You should always check with your doctor after having lab work or other tests done to discuss the meaning of your test results and whether your values are considered within normal limits. MEANING OF TEST  Your caregiver will go over the test results with you and discuss the importance and meaning of your results, as well as treatment options and the need for additional tests if necessary. OBTAINING THE TEST RESULTS It is your responsibility to obtain your test results. Ask the lab or department performing the test when and how you will get your results. Document Released: 04/28/2004  Document Revised: 06/29/2011 Document Reviewed: 03/14/2008 [facility] Patient Information 2013 [place], [place].

## 2012-07-04 NOTE — Progress Notes (Signed)
Patient is a 42 year old new patient to the practice who presented for followup as a result of a recent emergency room visit on March 10 where she was complaining of right flank right lower abdominal discomfort. She stated her last menstrual period was approximately 2-3 weeks ago which was normal. Patient has had previous tubal sterilization procedure she had described the pain as moderate in severity and sharp in quality. Today she described it as to 3/10.as part of her evaluation the emergency room she had a normal urinalysis, CBC,as well as comprehensive metabolic panel. Her CAT of the abdomen and pelvis without contrast demonstrated the following:  No acute abdominopelvic process identified.  No hydronephrosis or hydroureter. Unable to follow the ureters in  their entirety due to decompressed state.  7 cm right adnexal cyst. Recommend a 4-6 week pelvic ultrasound  follow-up to document resolution.There was no pelvic or abdominal lymphadenopathy.  Exam: There was minimal if any right CVA tenderness. Her right abdomen was slightly tender in the lower abdomen especially to her right lower quadrant. Pelvic: Bartholin urethra Skene was within normal limits Vagina: No lesions or discharge Cervix: No lesions or discharge Uterus: Anteverted tenderness and fullness noted in the right adnexa Adnexa: Fullness and tenderness the right adnexa Rectal exam: Tenderness in the right rectovaginal area with fullness in the right adnexa noted.  Assessment/plan: Patient with right ovarian cyst measuring 7 cm will be scheduled to return back to the office this week for a transvaginal ultrasound to determine the consistency of this ovarian cyst. She will have a CA 125 blood tests drawn today. Patient was informed of its limitations. She was given a prescription of Ultram 50 mg to take 1 by mouth every 6 hours when necessary she had been taking Vicodin that was prescribed in the emergency room but she ran out.

## 2012-07-06 ENCOUNTER — Ambulatory Visit (INDEPENDENT_AMBULATORY_CARE_PROVIDER_SITE_OTHER): Payer: 59

## 2012-07-06 ENCOUNTER — Ambulatory Visit (INDEPENDENT_AMBULATORY_CARE_PROVIDER_SITE_OTHER): Payer: 59 | Admitting: Gynecology

## 2012-07-06 ENCOUNTER — Other Ambulatory Visit: Payer: Self-pay | Admitting: Gynecology

## 2012-07-06 ENCOUNTER — Ambulatory Visit (HOSPITAL_COMMUNITY)
Admission: AD | Admit: 2012-07-06 | Discharge: 2012-07-06 | Disposition: A | Discharge status: Home / Self Care | Payer: 59 | Source: Ambulatory Visit | Attending: Gynecology | Admitting: Gynecology

## 2012-07-06 ENCOUNTER — Encounter: Payer: Self-pay | Admitting: Gynecology

## 2012-07-06 ENCOUNTER — Ambulatory Visit (HOSPITAL_COMMUNITY)
Admission: RE | Admit: 2012-07-06 | Discharge: 2012-07-06 | Disposition: A | Discharge status: Home / Self Care | Payer: 59 | Source: Ambulatory Visit | Attending: Gynecology | Admitting: Gynecology

## 2012-07-06 VITALS — BP 130/86

## 2012-07-06 DIAGNOSIS — N83209 Unspecified ovarian cyst, unspecified side: Secondary | ICD-10-CM

## 2012-07-06 DIAGNOSIS — D3911 Neoplasm of uncertain behavior of right ovary: Secondary | ICD-10-CM

## 2012-07-06 DIAGNOSIS — Z01818 Encounter for other preprocedural examination: Secondary | ICD-10-CM | POA: Insufficient documentation

## 2012-07-06 DIAGNOSIS — R9389 Abnormal findings on diagnostic imaging of other specified body structures: Secondary | ICD-10-CM

## 2012-07-06 DIAGNOSIS — R102 Pelvic and perineal pain: Secondary | ICD-10-CM

## 2012-07-06 DIAGNOSIS — D3910 Neoplasm of uncertain behavior of unspecified ovary: Secondary | ICD-10-CM

## 2012-07-06 DIAGNOSIS — R1900 Intra-abdominal and pelvic swelling, mass and lump, unspecified site: Secondary | ICD-10-CM

## 2012-07-06 DIAGNOSIS — N838 Other noninflammatory disorders of ovary, fallopian tube and broad ligament: Secondary | ICD-10-CM

## 2012-07-06 DIAGNOSIS — R1031 Right lower quadrant pain: Secondary | ICD-10-CM

## 2012-07-06 DIAGNOSIS — N839 Noninflammatory disorder of ovary, fallopian tube and broad ligament, unspecified: Secondary | ICD-10-CM

## 2012-07-06 NOTE — Patient Instructions (Addendum)
Laparotomy, Exploratory or Staging A laparotomy is a cut (incision) into the belly (abdominal cavity). In spite of modern x-rays, laboratory, and other diagnostic studies, the belly sometimes needs to be opened. This helps the caregiver to look and learning what is wrong. The caregiver can use different incisions depending on what will give him or her the best possible look into the abdomen. REASONS FOR LAPAROTOMY  It is commonly used following accidents with injury (trauma) to the abdomen. This helps the caregiver to see if damage is present. They may also be able to repair it if necessary. Following a trauma emergency, laparotomy is often done to control massive bleeding (hemorrhage). This procedure in these circumstances may be life saving. There may not be time for the surgeon to fully discuss this situation with the family or friends, or even the patient.  Laparotomy may be used to look for causes of fever when the reason for the fever is unknown. It may also be used when an exact reason) in not known.  Laparotomy is used to stage various tumors. This means it can be used to find out how far a disease has developed. An example of this is using laparotomy to see how far Hodgkin's disease or lymphoma has developed. Seeing how far a disease has spread by staging the disease helps the caregiver to decide on the best treatment.  Exploratory laparotomy may be used in a patient with severe abdominal pain. There may not be time for further diagnositic studies and going to the operating room is the best chance for cure. The surgeon is able to evaluate all of the contents of the abdominal cavity to find out what is causing the problem and intervene. PROCEDURE   The abdomen is examined and tissue samples (biopsies) are usually taken. Biopsies are small pieces of tissue that the caregiver takes from the abdomen. They are looked at under a microscope. They are looked at by a specialist (pathologist). The  specialist helps to determine what is wrong. Biopsies are performed for staging procedures only. If your caregiver took biopsies, you should ask how you are to find out about the results. You should ask this before you leave the hospital or surgical center.  The abdomen is closed up following the laparotomy. This is usually done using staples or stitches. If staples or stitches were used to close the skin, your surgeon will let you know when you are to be seen again to removed the sutures or staples.  The length of stay in the hospital depends on the procedures performed and the reason for the operation.  Surgeons are also performing diagnostic and exploratory laparoscopic procedures in certain situations. This allows for a minimally invasive approach. Keep your appointments as directed. Document Released: 12/30/2000 Document Revised: 06/29/2011 Document Reviewed: 10/26/2007 [facility] Patient Information 2013 [place], [place].  Ovarian Cyst The ovaries are small organs that are on each side of the uterus. The ovaries are the organs that produce the female hormones, estrogen and progesterone. An ovarian cyst is a sac filled with fluid that can vary in its size. It is normal for a small cyst to form in women who are in the childbearing age and who have menstrual periods. This type of cyst is called a follicle cyst that becomes an ovulation cyst (corpus luteum cyst) after it produces the women's egg. It later goes away on its own if the woman does not become pregnant. There are other kinds of ovarian cysts that may cause problems and  may need to be treated. The most serious problem is a cyst with cancer. It should be noted that menopausal women who have an ovarian cyst are at a higher risk of it being a cancer cyst. They should be evaluated very quickly, thoroughly and followed closely. This is especially true in menopausal women because of the high rate of ovarian cancer in women in menopause. CAUSES AND  TYPES OF OVARIAN CYSTS:  FUNCTIONAL CYST: The follicle/corpus luteum cyst is a functional cyst that occurs every month during ovulation with the menstrual cycle. They go away with the next menstrual cycle if the woman does not get pregnant. Usually, there are no symptoms with a functional cyst.  ENDOMETRIOMA CYST: This cyst develops from the lining of the uterus tissue. This cyst gets in or on the ovary. It grows every month from the bleeding during the menstrual period. It is also called a "chocolate cyst" because it becomes filled with blood that turns brown. This cyst can cause pain in the lower abdomen during intercourse and with your menstrual period.  CYSTADENOMA CYST: This cyst develops from the cells on the outside of the ovary. They usually are not cancerous. They can get very big and cause lower abdomen pain and pain with intercourse. This type of cyst can twist on itself, cut off its blood supply and cause severe pain. It also can easily rupture and cause a lot of pain.  DERMOID CYST: This type of cyst is sometimes found in both ovaries. They are found to have different kinds of body tissue in the cyst. The tissue includes skin, teeth, hair, and/or cartilage. They usually do not have symptoms unless they get very big. Dermoid cysts are rarely cancerous.  POLYCYSTIC OVARY: This is a rare condition with hormone problems that produces many small cysts on both ovaries. The cysts are follicle-like cysts that never produce an egg and become a corpus luteum. It can cause an increase in body weight, infertility, acne, increase in body and facial hair and lack of menstrual periods or rare menstrual periods. Many women with this problem develop type 2 diabetes. The exact cause of this problem is unknown. A polycystic ovary is rarely cancerous.  THECA LUTEIN CYST: Occurs when too much hormone (human chorionic gonadotropin) is produced and over-stimulates the ovaries to produce an egg. They are frequently  seen when doctors stimulate the ovaries for invitro-fertilization (test tube babies).  LUTEOMA CYST: This cyst is seen during pregnancy. Rarely it can cause an obstruction to the birth canal during labor and delivery. They usually go away after delivery. SYMPTOMS   Pelvic pain or pressure.  Pain during sexual intercourse.  Increasing girth (swelling) of the abdomen.  Abnormal menstrual periods.  Increasing pain with menstrual periods.  You stop having menstrual periods and you are not pregnant. DIAGNOSIS  The diagnosis can be made during:  Routine or annual pelvic examination (common).  Ultrasound.  X-ray of the pelvis.  CT Scan.  MRI.  Blood tests. TREATMENT   Treatment may only be to follow the cyst monthly for 2 to 3 months with your caregiver. Many go away on their own, especially functional cysts.  May be aspirated (drained) with a long needle with ultrasound, or by laparoscopy (inserting a tube into the pelvis through a small incision).  The whole cyst can be removed by laparoscopy.  Sometimes the cyst may need to be removed through an incision in the lower abdomen.  Hormone treatment is sometimes used to help dissolve certain  cysts.  Birth control pills are sometimes used to help dissolve certain cysts. HOME CARE INSTRUCTIONS  Follow your caregiver's advice regarding:  Medicine.  Follow up visits to evaluate and treat the cyst.  You may need to come back or make an appointment with another caregiver, to find the exact cause of your cyst, if your caregiver is not a gynecologist.  Get your yearly and recommended pelvic examinations and Pap tests.  Let your caregiver know if you have had an ovarian cyst in the past. SEEK MEDICAL CARE IF:   Your periods are late, irregular, they stop, or are painful.  Your stomach (abdomen) or pelvic pain does not go away.  Your stomach becomes larger or swollen.  You have pressure on your bladder or trouble emptying  your bladder completely.  You have painful sexual intercourse.  You have feelings of fullness, pressure, or discomfort in your stomach.  You lose weight for no apparent reason.  You feel generally ill.  You become constipated.  You lose your appetite.  You develop acne.  You have an increase in body and facial hair.  You are gaining weight, without changing your exercise and eating habits.  You think you are pregnant. SEEK IMMEDIATE MEDICAL CARE IF:   You have increasing abdominal pain.  You feel sick to your stomach (nausea) and/or vomit.  You develop a fever that comes on suddenly.  You develop abdominal pain during a bowel movement.  Your menstrual periods become heavier than usual. Document Released: 04/06/2005 Document Revised: 06/29/2011 Document Reviewed: 02/07/2009 [facility] Patient Information 2013 [place], [place].

## 2012-07-06 NOTE — Progress Notes (Signed)
Patient is a 42 year old who was seen in the office as a new patient on March 17 for followup as a result of a recent emergency room visit on March 10 where she was complaining of right flank right lower abdominal discomfort. She stated her last menstrual period was approximately 2-3 weeks ago which was normal. Patient has had previous tubal sterilization procedure she had described the pain as moderate in severity and sharp in quality. Today she described it as to 3/10.as part of her evaluation the emergency room she had a normal urinalysis, CBC,as well as comprehensive metabolic panel. Her CAT of the abdomen and pelvis without contrast demonstrated the following:   No acute abdominopelvic process identified.  No hydronephrosis or hydroureter. Unable to follow the ureters in  their entirety due to decompressed state.  7 cm right adnexal cyst. Recommend a 4-6 week pelvic ultrasound  follow-up to document resolution.There was no pelvic or abdominal lymphadenopathy.  During that visit her pelvic exam had demonstrated slightly tender right lower abdomen and on bimanual examination a fullness was noted and some tenderness as well she was asked to return today for an ultrasound as well as to discuss the results of her recent CA 125. Her CA 125 was in the normal range (22.3). Her ultrasound today as follows:  Uterus measured 11.1 x 8.0 x 6.6 cm with endometrial stripe of 19.8 mm. Anteverted uterus displaced by pelvic mass. Prominent endometrial cavity. Right ovary seen above the fundus of the uterus with an echo-free cyst measuring 29 x 28 x 23 mm. Right adnexal mass total dimension 9.9 x by 6.2 x 8 cm solid cystic mass with arterial blood flow within the mass with a small cystic area measured 8 x 5 x 7 cm with a solid nodule measuring 7 mm within along with a small thin septum. Left ovary was normal.  The findings above were discussed with the patient. We are going to obtain an ova-1 tumor marker for ovarian  cancer which is more broader than the CA 125 alone. We will also schedule a chest x-ray PA and lateral. We are going to plan a diagnostic laparoscopy with possible laparotomy with right salpingo-oophorectomy in the next few weeks. We'll need to see her for preoperative consultation examination prior to that.patient is currently taking Ultram 50 mg by mouth every 6 hours when necessary. Patient with negative urine pregnancy test on March 11 along with a normal CBC.

## 2012-07-11 ENCOUNTER — Other Ambulatory Visit: Payer: Self-pay | Admitting: Gynecology

## 2012-07-11 ENCOUNTER — Telehealth: Payer: Self-pay

## 2012-07-11 MED ORDER — IBUPROFEN 800 MG PO TABS: 800.0000 mg | ORAL_TABLET | Freq: Three times a day (TID) | ORAL | Status: DC

## 2012-07-11 MED ORDER — TRAMADOL HCL 50 MG PO TABS: 50.0000 mg | ORAL_TABLET | Freq: Four times a day (QID) | ORAL | Status: DC | PRN

## 2012-07-11 NOTE — Telephone Encounter (Signed)
Patient informed that I spoke with Dr. Glenetta Hew and he does want her to use these meds if she needs them. I have called refills in to her pharmacy.

## 2012-07-11 NOTE — Telephone Encounter (Signed)
Patient is scheduled for surgery 07/18/12.  She said that you prescribed Tramadol and Ibuprofen 800 and she has been taking.  She asked if you wanted her to continue with these rx's. If so she needs refills.

## 2012-07-12 ENCOUNTER — Telehealth: Payer: Self-pay | Admitting: Gynecology

## 2012-07-12 ENCOUNTER — Encounter (HOSPITAL_COMMUNITY)
Admission: RE | Admit: 2012-07-12 | Discharge: 2012-07-12 | Disposition: A | Discharge status: Home / Self Care | Payer: 59 | Source: Ambulatory Visit | Attending: Gynecology | Admitting: Gynecology

## 2012-07-12 ENCOUNTER — Telehealth: Payer: Self-pay

## 2012-07-12 ENCOUNTER — Telehealth: Payer: Self-pay | Admitting: *Deleted

## 2012-07-12 NOTE — Telephone Encounter (Signed)
I have spoken with the patient today to inform her that the oval 1 was in the range of high probability for her ovarian mass to be malignant. Premenopausal value should be less than 5.0 and patient was 5.1. Although she had a normal CA 125, with the appearance of the cyst and now the result of the oval 1 results I am recommending her to the GYN oncologist from [facility] Dr. Owens Shark which I had also spoken with today. Patient will be seen by her in consultation on April 10 to coordinate her exploratory surgery. We will cancel her previously scheduled laparoscopy for next week as well as her preop labs to wait and hear from the GYN oncologist so that we can coordinate her case together. All questions were answered.

## 2012-07-12 NOTE — Telephone Encounter (Signed)
Patient was contacted earlier today by Dr. Glenetta Hew and told that he would like for her to see Dr. Lester [place] [facility]YN oncologist and she will be the one to do surgery.  Her surgery and pre-op was cancelled here.  Patient was inquiring regarding disability papers. I suggested that she cancel FMLA currently on file as surgery date is incorrect and return to work date is correct.  She said she has been out of work since March 11 and has to have something to cover her. She said that FMLA could be ammended later.  She then asked about the disability forms i received today from [employer]. I told her I had not planned to fill them out and instead let Dr. Duard Brady be the one to support her disability claim since she would be the actual primary surgeon in her care.  She said she had to have them filled out because by the time she sees Dr. Duard Brady April 7 she will have been out of work for a month and she needs something sent to [employer].  She INSISTED that I fill them out.  I agreed telling her I will make a note as to the circumstance and I will complete and fax as she has requested.

## 2012-07-12 NOTE — Telephone Encounter (Signed)
Appt. With Dr.Gehrig on 07/28/12 @ 1:45 pm pt informed.

## 2012-07-12 NOTE — Pre-Procedure Instructions (Addendum)
Pt presents for PAT appt but received call from Dr Lily Peer that surgery will be postponed until she can be seen by specialist at [facility]. No labs or interview done.  Call received from Olegario Messier, MD office confirming above information.

## 2012-07-13 ENCOUNTER — Institutional Professional Consult (permissible substitution): Payer: 59 | Admitting: Gynecology

## 2012-07-18 ENCOUNTER — Encounter (HOSPITAL_COMMUNITY): Admission: RE | Payer: Self-pay | Source: Ambulatory Visit

## 2012-07-18 ENCOUNTER — Ambulatory Visit (HOSPITAL_COMMUNITY): Admission: RE | Admit: 2012-07-18 | Payer: 59 | Source: Ambulatory Visit | Admitting: Gynecology

## 2012-07-18 SURGERY — LAPAROSCOPY OPERATIVE
Anesthesia: General | Laterality: Right

## 2012-07-26 ENCOUNTER — Telehealth: Payer: Self-pay

## 2012-07-26 NOTE — Telephone Encounter (Signed)
Patient called stating her disability claim had been sent for review because the disability co said that they had called and could not reach [place].  She asked me to fax all her records to the disability co.  I sent her records with the form I had completed earlier and a letter explaining that patient said her claim delayed and I provided my direct phone number to her case handler.  Patient informed that all this was completed.

## 2012-07-28 ENCOUNTER — Other Ambulatory Visit: Payer: Self-pay | Admitting: Gynecologic Oncology

## 2012-07-28 ENCOUNTER — Telehealth: Payer: Self-pay

## 2012-07-28 ENCOUNTER — Encounter: Payer: Self-pay | Admitting: Gynecologic Oncology

## 2012-07-28 ENCOUNTER — Other Ambulatory Visit (HOSPITAL_COMMUNITY)
Admission: RE | Admit: 2012-07-28 | Discharge: 2012-07-28 | Disposition: A | Discharge status: Home / Self Care | Payer: 59 | Source: Ambulatory Visit | Attending: Gynecologic Oncology | Admitting: Gynecologic Oncology

## 2012-07-28 ENCOUNTER — Ambulatory Visit: Payer: 59 | Attending: Gynecologic Oncology | Admitting: Gynecologic Oncology

## 2012-07-28 VITALS — BP 128/90 | HR 66 | Temp 98.2°F | Resp 18 | Ht 67.0 in | Wt 210.4 lb

## 2012-07-28 DIAGNOSIS — N839 Noninflammatory disorder of ovary, fallopian tube and broad ligament, unspecified: Secondary | ICD-10-CM | POA: Insufficient documentation

## 2012-07-28 DIAGNOSIS — Z01419 Encounter for gynecological examination (general) (routine) without abnormal findings: Secondary | ICD-10-CM | POA: Insufficient documentation

## 2012-07-28 DIAGNOSIS — N838 Other noninflammatory disorders of ovary, fallopian tube and broad ligament: Secondary | ICD-10-CM

## 2012-07-28 DIAGNOSIS — N9489 Other specified conditions associated with female genital organs and menstrual cycle: Secondary | ICD-10-CM | POA: Insufficient documentation

## 2012-07-28 NOTE — Addendum Note (Signed)
Addended by: Warner Mccreedy D on: 07/28/2012 04:05 PM   Modules accepted: Orders

## 2012-07-28 NOTE — Telephone Encounter (Signed)
Patient called in voice mail stating that the disability co. Had denied her claim.  I have sent them all of her records related to this event as she had requested.  Not sure what else I can do.  When I returned her call she said that the ins co had called this morning and approved her claim.  She said that the rep did not realize they had received all the notes I faxed two days ago.

## 2012-07-28 NOTE — Patient Instructions (Signed)
Ovarian Tumors  The ovaries are small organs that produce eggs in women. They lie on each side of the uterus. Tumors are solid growths on the ovary, not like ovarian cysts that are filled with fluid. They can be cancerous or noncancerous. All solid tumors should be looked at to make sure they are not cancer tumors.   CAUSES   There are no known causes for developing a solid tumor on the ovary. However, there are several risk factors for developing cancerous tumors on the ovary, such as:   Aging.   North American or North European descent.   Personal or family history of ovarian, colon and breast cancer.   Women with BRCA 1 or BRCA 2 genes are at high risk for getting ovarian cancer.   The use of fertility medications to get pregnant may increase the risk for getting ovarian cancer.   Late menopause (after age 50).   Women who become pregnant for the first time at 30 or older.  Having these risk factors does not mean you will get ovarian cancer. However, you should know about them and report any that you have to your caregiver. Also, a woman with none of these risk factors can still get ovarian cancer.  SYMPTOMS   In many cases there are no symptoms. Noncancerous tumors usually have no symptoms but cancerous tumors may have symptoms that are minor and resemble other health problems. The following are symptoms that may be important to diagnosing cancer of the ovary:   Unexplained weight loss.   Increase abdominal size.   Pain in the belly (abdomen).   Pain or pressure in the back and pelvis.   Tiredness.   Abnormal vaginal bleeding.   Loss of appetite.   Frequent urination or pressure on your bladder.   Indigestion, increase gas and bloating.   Painful sexual intercourse.  DIAGNOSIS    During an exam, an abnormal mass may be found in the pelvis. It is important to have a rectovaginal examination to help find pelvic masses, especially in women over 40 years old.   An ultrasound may be done.   X-ray,  CT scan or MRI imaging.   Blood tests.   A Pap test does not help in diagnosing tumors or cancer of the ovary. New screening tests are always being studied to detect early ovarian cancer.  TREATMENT    All solid tumors of the ovary should be evaluated, usually with surgery, to make sure they are not cancerous.   The tumor will be studied in the lab under the microscope to see if it is cancer.   Noncancerous tumors can be removed surgically with or without removing the ovary.   Cancerous tumors usually are removed with the ovary and sometimes both ovaries are removed with the Fallopian tubes, uterus and surrounding lymph nodes to see if the cancer has spread.   Cancerous tumors may also be treated along with the surgery with radiation, chemotherapy or both.   The surgeon should be a gynecology oncologist (cancer specialist in gynecology cancer surgery) and the chemotherapist and radiation therapist should be experienced specialists in their field.  HOME CARE INSTRUCTIONS    Inform your caregiver if you or anyone in your family has had cancer.   Follow your caregiver's advice and recommendations regarding medications and follow up care.   Get a yearly physical and gynecology exams. This includes a rectovaginal exam if you are 40 years old or older.  SEEK MEDICAL CARE IF:      You have any of the above symptoms that have not gone away after a week of treatment.   You are losing weight for no reason.   You feel generally ill.  Document Released: 01/14/2008 Document Revised: 06/29/2011 Document Reviewed: 01/14/2008  [employer] Patient Information 2013 [employer]

## 2012-07-28 NOTE — Progress Notes (Signed)
Consult Note: Gyn-Onc  Jasmine Franklin 42 y.o. female  CC:  Chief Complaint  Patient presents with  . Ovarian mass    New Consult    HPI:  Patient is seen in consultation today at the request of Dr. Juan Fernandez. Patient is a 42-year-old gravida 5 para 5 who had new onset of abdominal pain 2 days prior to presenting to the emergency room at the end of March. She states her pain is primarily in her back and she used some over-the-counter medication that helped her discomfort.  The pain then removed from her back to her side and she went to the emergency room with severe abdominal pain.  In the emergency room she underwent a CT scan as it was felt that she most likely had a renal stone.  CT scan revealed:  Findings: Limited images through the lung bases demonstrate no significant appreciable abnormality. The heart size is within normal limits. No pleural or pericardial effusion. Organ abnormality/lesion detection is limited in the absence of intravenous contrast. Within this limitation, left hepatic lobe cyst. Unremarkable biliary system, spleen, pancreas, adrenal glands. Symmetric renal size. No hydronephrosis or hydroureter. Unable to follow the ureteral course in their entirety due to the decompressed state. No definite ureteral stones. No CT evidence for colitis. Normal appendix. Small bowel loops are normal course and caliber. No free intraperitoneal air or fluid. No lymphadenopathy.  Normal caliber aorta and branch vessels. Nonspecific right adnexal cyst measuring up to seven centimeters. Partially decompressed bladder. No acute osseous finding. Multilevel degenerative change/facet arthropathy, mild.   IMPRESSION:  No acute abdominopelvic process identified.  No hydronephrosis or hydroureter. Unable to follow the ureters in their entirety due to decompressed state.  7 cm right adnexal cyst. Recommend a 4-6 week pelvic ultrasound follow-up to document resolution.  She was seen by Dr.  Fernandez in the office and had an ultrasound. The uterus measures 11.1 x 8 x 6.6 cm with endometrial stripe of 1920 mm. It was anteverted and the pelvic mass. The right ovary was seen and per the fundus of the uterus with an echo free cyst measuring 2.9 x 2.8 x 2.3 cm. The right adnexal mass that it will mention of 9.9 x 6.2 x 8 cm with a cystic and solid area. There is a solid nodule measuring 7 mm along a thin septation. The left ovary was normal. The patient had an ova 1 tumor marker was 5.1 with the upper limit of normal being 5 and she was subsequently referred to us for evaluation.  She states her pain is currently a 3-4. She is using tramadol and ibuprofen. She's able to work. She notices increased discomfort with bowel movements. She does have some increased pain when bending or standing for prolonged period of time. She had her cycle last week he was regular but a little bit heavier than usual as was her cycle in March. She has a change about bladder habits. She denies any nausea or vomiting. She denies any early satiety.  She stay she's due for a Pap smear as well as was of 16 months ago. She's never had an abnormal Pap smear. Her mammogram is due.   Current Meds:  Outpatient Encounter Prescriptions as of 07/28/2012  Medication Sig Dispense Refill  . HYDROcodone-acetaminophen (NORCO/VICODIN) 5-325 MG per tablet Take 2 tablets by mouth every 4 (four) hours as needed for pain.  15 tablet  0  . ibuprofen (ADVIL,MOTRIN) 800 MG tablet Take 1 tablet (800 mg total)   by mouth 3 (three) times daily.  30 tablet  1  . traMADol (ULTRAM) 50 MG tablet Take 1 tablet (50 mg total) by mouth every 6 (six) hours as needed for pain.  30 tablet  0   No facility-administered encounter medications on file as of 07/28/2012.    Allergy: No Known Allergies  Social Hx:   History   Social History  . Marital Status: Single    Spouse Name: N/A    Number of Children: N/A  . Years of Education: N/A    Occupational History  . Not on file.   Social History Main Topics  . Smoking status: Never Smoker   . Smokeless tobacco: Never Used  . Alcohol Use: No  . Drug Use: No  . Sexually Active: Yes    Birth Control/ Protection: None   Other Topics Concern  . Not on file   Social History Narrative  . No narrative on file    Past Surgical Hx:  Past Surgical History  Procedure Laterality Date  . Cesarean section    . Tubal ligation      Past Medical Hx:  Past Medical History  Diagnosis Date  . Slipped cervical disc 2011    Family Hx: No family history on file.  Vitals:  Blood pressure 128/90, pulse 66, temperature 98.2 F (36.8 C), temperature source Oral, resp. rate 18, height 5' 7" (1.702 m), weight 210 lb 6.4 oz (95.437 kg), last menstrual period 07/18/2012.  Physical Exam:  Well-nourished well-developed female in no acute distress.  Neck: Supple, no lymphadenopathy, no thyromegaly.  Lungs: Clear to auscultation bilaterally.  Cardiovascular: Regular rate and rhythm.  Abdomen: Soft, nontender, nondistended. No palpable masses or hepatosplenomegaly.  Groins: No lymphadenopathy.  Extremities no edema.  Pelvic: External genitalia within normal limits. Vagina is a physiologic discharge. The cervix is multiparous. ThinPrep Pap smear without difficulty. Bimanual examination does reveal some tenderness. There is about a 12 cm mass encompasses the uterus and the mass they are mobile together. There is no nodularity appreciated. It appears to be arising on the right side with some component within the cul-de-sac.  Assessment/Plan: 42-year-old with complex right adnexal mass in minimally elevated OVA 1 tumor marker. I discussed proceeding with surgery with the patient. She wishes to proceed with a right salpingo-oophorectomy. The mass to be sent for frozen section. She understands if the mass is benign and should there be no pathology involving her cervix at the time of this  Pap smear that we would proceed with a left salpingectomy conclude the procedure. She understands if the mass is malignant we have to proceed with a completion hysterectomy and appropriate staging. She would like for us to proceed in a minimally invasive fashion. I believe that a robotic assistance with the ideal should we have to stage her it provides for ease in periaortic nodal dissection and she is amenable to this. We will tentatively schedule her surgery for April 29. We'll contact Dr. Fernandez to coordinate schedules.  She will need for preoperative visit. Her questions were elicited in answer to her satisfaction. She has my card and will, with any questions. She is very satisfied with her visit today to  Tyrisha Benninger A., MD 07/28/2012, 2:56 PM  

## 2012-07-29 ENCOUNTER — Other Ambulatory Visit: Payer: Self-pay | Admitting: Gynecology

## 2012-07-29 MED ORDER — TRAMADOL HCL 50 MG PO TABS: 50.0000 mg | ORAL_TABLET | Freq: Four times a day (QID) | ORAL | Status: DC | PRN

## 2012-08-04 ENCOUNTER — Telehealth: Payer: Self-pay | Admitting: Gynecologic Oncology

## 2012-08-04 ENCOUNTER — Telehealth: Payer: Self-pay

## 2012-08-04 NOTE — Telephone Encounter (Signed)
Message left for patient with pap smear results: negative.  Instructed to call for any questions or concerns.  

## 2012-08-04 NOTE — Telephone Encounter (Signed)
Jasmine Franklin said that Dr. Duard Brady is planning robotic surgery for patient.  You had mentioned coordinating it but Jasmine Franklin said she did not think you were doing robotic surgery.  They will go on with scheduling accordingly.

## 2012-08-05 NOTE — Telephone Encounter (Signed)
Yes, please her to go on and schedule without me. Thanks

## 2012-08-08 ENCOUNTER — Encounter (HOSPITAL_COMMUNITY): Payer: Self-pay | Admitting: Pharmacy Technician

## 2012-08-08 ENCOUNTER — Other Ambulatory Visit: Payer: Self-pay | Admitting: Gynecology

## 2012-08-08 NOTE — Progress Notes (Signed)
NEED ORDERS FOR 4-29 SURGERY  PRE OP IS [phone]

## 2012-08-08 NOTE — Telephone Encounter (Signed)
Jasmine Franklin. Informed.

## 2012-08-12 ENCOUNTER — Encounter (HOSPITAL_COMMUNITY)
Admission: RE | Admit: 2012-08-12 | Discharge: 2012-08-12 | Disposition: A | Discharge status: Home / Self Care | Payer: 59 | Source: Ambulatory Visit | Attending: Gynecologic Oncology | Admitting: Gynecologic Oncology

## 2012-08-12 ENCOUNTER — Encounter (HOSPITAL_COMMUNITY): Payer: Self-pay

## 2012-08-12 LAB — COMPREHENSIVE METABOLIC PANEL
ALT: 13 U/L (ref 0–35)
Albumin: 3.6 g/dL (ref 3.5–5.2)
Alkaline Phosphatase: 87 U/L (ref 39–117)
BUN: 14 mg/dL (ref 6–23)
Chloride: 101 mEq/L (ref 96–112)
Glucose, Bld: 92 mg/dL (ref 70–99)
Potassium: 4.1 mEq/L (ref 3.5–5.1)
Sodium: 138 mEq/L (ref 135–145)
Total Bilirubin: 0.4 mg/dL (ref 0.3–1.2)
Total Protein: 7.4 g/dL (ref 6.0–8.3)

## 2012-08-12 LAB — CBC WITH DIFFERENTIAL/PLATELET
Eosinophils Absolute: 0.2 10*3/uL (ref 0.0–0.7)
Hemoglobin: 13 g/dL (ref 12.0–15.0)
Lymphs Abs: 2 10*3/uL (ref 0.7–4.0)
MCH: 29.3 pg (ref 26.0–34.0)
Monocytes Relative: 9 % (ref 3–12)
Neutro Abs: 3 10*3/uL (ref 1.7–7.7)
Neutrophils Relative %: 52 % (ref 43–77)
Platelets: 334 10*3/uL (ref 150–400)
RBC: 4.44 MIL/uL (ref 3.87–5.11)
WBC: 5.8 10*3/uL (ref 4.0–10.5)

## 2012-08-12 LAB — SURGICAL PCR SCREEN
MRSA, PCR: NEGATIVE
Staphylococcus aureus: NEGATIVE

## 2012-08-12 LAB — HCG, SERUM, QUALITATIVE: Preg, Serum: NEGATIVE

## 2012-08-12 LAB — ABO/RH: ABO/RH(D): O POS

## 2012-08-12 NOTE — Patient Instructions (Signed)
YOUR SURGERY IS SCHEDULED AT [facility]  ON:  Tuesday  4/29  REPORT TO [place] [facility] AT:  8:00 AM      PHONE # FOR SHORT STAY IS [phone]                   CLEAR LIQUID DIET THE DAY BEFORE YOUR SURGERY.  DO NOT EAT OR DRINK ANYTHING AFTER MIDNIGHT THE NIGHT BEFORE YOUR SURGERY.  YOU MAY BRUSH YOUR TEETH, RINSE OUT YOUR MOUTH--BUT NO WATER, NO FOOD, NO CHEWING GUM, NO MINTS, NO CANDIES, NO CHEWING TOBACCO.  PLEASE TAKE THE FOLLOWING MEDICATIONS THE AM OF YOUR SURGERY WITH A FEW SIPS OF WATER:  NO MEDICINES TO TAKE    DO NOT BRING VALUABLES, MONEY, CREDIT CARDS.  DO NOT WEAR JEWELRY, MAKE-UP, NAIL POLISH AND NO METAL PINS OR CLIPS IN YOUR HAIR. CONTACT LENS, DENTURES / PARTIALS, GLASSES SHOULD NOT BE WORN TO SURGERY AND IN MOST CASES-HEARING AIDS WILL NEED TO BE REMOVED.  BRING YOUR GLASSES CASE, ANY EQUIPMENT NEEDED FOR YOUR CONTACT LENS. FOR PATIENTS ADMITTED TO THE HOSPITAL--CHECK OUT TIME THE DAY OF DISCHARGE IS 11:00 AM.  ALL INPATIENT ROOMS ARE PRIVATE - WITH BATHROOM, TELEPHONE, TELEVISION AND WIFI I   TURNING, COUGHING, DEEP BREATHING, LEG EXERCISES ARE IMPORTANT TO DO EVERY 2 HOURS WHILE AWAKE - AFTER YOUR SURGERY.                           PLEASE READ OVER ANY  FACT SHEETS THAT YOU WERE GIVEN: MRSA INFORMATION, BLOOD TRANSFUSION INFORMATION, INCENTIVE SPIROMETER INFORMATION. FAILURE TO FOLLOW THESE INSTRUCTIONS MAY RESULT IN THE CANCELLATION OF YOUR SURGERY.   PATIENT SIGNATURE_________________________________

## 2012-08-12 NOTE — Pre-Procedure Instructions (Signed)
CXR REPORT IN EPIC FROM 07/06/12. EKG WAS DONE TODAY AT [facility].

## 2012-08-16 ENCOUNTER — Ambulatory Visit (HOSPITAL_COMMUNITY)
Admission: RE | Admit: 2012-08-16 | Discharge: 2012-08-16 | Disposition: A | Discharge status: Home / Self Care | Payer: 59 | Source: Ambulatory Visit | Attending: Obstetrics & Gynecology | Admitting: Obstetrics & Gynecology

## 2012-08-16 ENCOUNTER — Encounter (HOSPITAL_COMMUNITY): Payer: Self-pay | Admitting: *Deleted

## 2012-08-16 ENCOUNTER — Ambulatory Visit (HOSPITAL_COMMUNITY): Payer: 59 | Admitting: *Deleted

## 2012-08-16 ENCOUNTER — Encounter (HOSPITAL_COMMUNITY)
Admission: RE | Disposition: A | Discharge status: Home / Self Care | Payer: Self-pay | Source: Ambulatory Visit | Attending: Obstetrics & Gynecology

## 2012-08-16 DIAGNOSIS — N802 Endometriosis of fallopian tube: Secondary | ICD-10-CM | POA: Insufficient documentation

## 2012-08-16 DIAGNOSIS — N736 Female pelvic peritoneal adhesions (postinfective): Secondary | ICD-10-CM | POA: Insufficient documentation

## 2012-08-16 DIAGNOSIS — R1900 Intra-abdominal and pelvic swelling, mass and lump, unspecified site: Secondary | ICD-10-CM

## 2012-08-16 DIAGNOSIS — N80209 Endometriosis of unspecified fallopian tube, unspecified depth: Secondary | ICD-10-CM | POA: Insufficient documentation

## 2012-08-16 DIAGNOSIS — Z79899 Other long term (current) drug therapy: Secondary | ICD-10-CM | POA: Insufficient documentation

## 2012-08-16 DIAGNOSIS — N83201 Unspecified ovarian cyst, right side: Secondary | ICD-10-CM

## 2012-08-16 HISTORY — PX: LAPAROSCOPIC BILATERAL SALPINGECTOMY: SHX5889

## 2012-08-16 LAB — TYPE AND SCREEN: ABO/RH(D): O POS

## 2012-08-16 SURGERY — SALPINGECTOMY, BILATERAL, LAPAROSCOPIC
Anesthesia: General | Laterality: Bilateral | Wound class: Clean

## 2012-08-16 MED ORDER — OXYCODONE-ACETAMINOPHEN 5-325 MG PO TABS: 2.0000 | ORAL_TABLET | Freq: Four times a day (QID) | ORAL | Status: DC | PRN

## 2012-08-16 MED ORDER — ACETAMINOPHEN 325 MG PO TABS: 650.0000 mg | ORAL_TABLET | ORAL | Status: DC | PRN

## 2012-08-16 MED ORDER — PHENYLEPHRINE HCL 10 MG/ML IJ SOLN
INTRAMUSCULAR | Status: DC | PRN
  Administered 2012-08-16: 80 ug via INTRAVENOUS
  Administered 2012-08-16: 40 ug via INTRAVENOUS
  Administered 2012-08-16: 80 ug via INTRAVENOUS

## 2012-08-16 MED ORDER — ONDANSETRON HCL 4 MG/2ML IJ SOLN: 4.0000 mg | Freq: Four times a day (QID) | INTRAMUSCULAR | Status: DC | PRN

## 2012-08-16 MED ORDER — DEXAMETHASONE SODIUM PHOSPHATE 4 MG/ML IJ SOLN
INTRAMUSCULAR | Status: DC | PRN
  Administered 2012-08-16: 10 mg via INTRAVENOUS

## 2012-08-16 MED ORDER — GLYCOPYRROLATE 0.2 MG/ML IJ SOLN
INTRAMUSCULAR | Status: DC | PRN
  Administered 2012-08-16: 0.6 mg via INTRAVENOUS

## 2012-08-16 MED ORDER — NEOSTIGMINE METHYLSULFATE 1 MG/ML IJ SOLN
INTRAMUSCULAR | Status: DC | PRN
  Administered 2012-08-16: 5 mg via INTRAVENOUS

## 2012-08-16 MED ORDER — HYDROMORPHONE HCL PF 1 MG/ML IJ SOLN: 0.2500 mg | INTRAMUSCULAR | Status: DC | PRN

## 2012-08-16 MED ORDER — ONDANSETRON HCL 4 MG/2ML IJ SOLN
INTRAMUSCULAR | Status: DC | PRN
  Administered 2012-08-16: 4 mg via INTRAVENOUS

## 2012-08-16 MED ORDER — SODIUM CHLORIDE 0.9 % IJ SOLN: 3.0000 mL | INTRAMUSCULAR | Status: DC | PRN

## 2012-08-16 MED ORDER — METOCLOPRAMIDE HCL 5 MG/ML IJ SOLN
INTRAMUSCULAR | Status: DC | PRN
  Administered 2012-08-16: 10 mg via INTRAVENOUS

## 2012-08-16 MED ORDER — STERILE WATER FOR IRRIGATION IR SOLN
Status: DC | PRN
  Administered 2012-08-16: 3000 mL

## 2012-08-16 MED ORDER — PROPOFOL 10 MG/ML IV BOLUS
INTRAVENOUS | Status: DC | PRN
  Administered 2012-08-16: 200 mg via INTRAVENOUS

## 2012-08-16 MED ORDER — KETOROLAC TROMETHAMINE 30 MG/ML IJ SOLN
30.0000 mg | Freq: Four times a day (QID) | INTRAMUSCULAR | Status: DC
  Administered 2012-08-16: 30 mg via INTRAVENOUS
  Filled 2012-08-16: qty 1

## 2012-08-16 MED ORDER — FENTANYL CITRATE 0.05 MG/ML IJ SOLN
INTRAMUSCULAR | Status: DC | PRN
  Administered 2012-08-16: 100 ug via INTRAVENOUS
  Administered 2012-08-16: 50 ug via INTRAVENOUS

## 2012-08-16 MED ORDER — ACETAMINOPHEN 650 MG RE SUPP
650.0000 mg | RECTAL | Status: DC | PRN
  Filled 2012-08-16: qty 1

## 2012-08-16 MED ORDER — LACTATED RINGERS IV SOLN
INTRAVENOUS | Status: DC
  Administered 2012-08-16: 1000 mL via INTRAVENOUS
  Administered 2012-08-16: 11:00:00 via INTRAVENOUS

## 2012-08-16 MED ORDER — MIDAZOLAM HCL 5 MG/5ML IJ SOLN
INTRAMUSCULAR | Status: DC | PRN
  Administered 2012-08-16: 2 mg via INTRAVENOUS

## 2012-08-16 MED ORDER — LACTATED RINGERS IV SOLN: INTRAVENOUS | Status: DC

## 2012-08-16 MED ORDER — SODIUM CHLORIDE 0.9 % IV SOLN: 250.0000 mL | INTRAVENOUS | Status: DC | PRN

## 2012-08-16 MED ORDER — LACTATED RINGERS IR SOLN
Status: DC | PRN
  Administered 2012-08-16: 100 mL

## 2012-08-16 MED ORDER — SODIUM CHLORIDE 0.9 % IJ SOLN: 3.0000 mL | Freq: Two times a day (BID) | INTRAMUSCULAR | Status: DC

## 2012-08-16 MED ORDER — ACETAMINOPHEN 10 MG/ML IV SOLN
INTRAVENOUS | Status: DC | PRN
  Administered 2012-08-16: 1000 mg via INTRAVENOUS

## 2012-08-16 MED ORDER — ACETAMINOPHEN 10 MG/ML IV SOLN
INTRAVENOUS | Status: AC
  Filled 2012-08-16: qty 100

## 2012-08-16 MED ORDER — ROCURONIUM BROMIDE 100 MG/10ML IV SOLN
INTRAVENOUS | Status: DC | PRN
  Administered 2012-08-16 (×2): 5 mg via INTRAVENOUS
  Administered 2012-08-16: 40 mg via INTRAVENOUS

## 2012-08-16 MED ORDER — HYDROMORPHONE HCL PF 1 MG/ML IJ SOLN
INTRAMUSCULAR | Status: DC | PRN
  Administered 2012-08-16 (×2): 1 mg via INTRAVENOUS

## 2012-08-16 MED ORDER — OXYCODONE HCL 5 MG PO TABS: 5.0000 mg | ORAL_TABLET | ORAL | Status: DC | PRN

## 2012-08-16 SURGICAL SUPPLY — 55 items
APL SKNCLS STERI-STRIP NONHPOA (GAUZE/BANDAGES/DRESSINGS) ×1
BAG SPEC RTRVL LRG 6X4 10 (ENDOMECHANICALS) ×1
BENZOIN TINCTURE PRP APPL 2/3 (GAUZE/BANDAGES/DRESSINGS) ×2 IMPLANT
CHLORAPREP W/TINT 26ML (MISCELLANEOUS) ×2 IMPLANT
CLOTH BEACON ORANGE TIMEOUT ST (SAFETY) ×2 IMPLANT
CORD HIGH FREQUENCY UNIPOLAR (ELECTROSURGICAL) ×2 IMPLANT
CORDS BIPOLAR (ELECTRODE) ×2 IMPLANT
COVER MAYO STAND STRL (DRAPES) ×2 IMPLANT
COVER SURGICAL LIGHT HANDLE (MISCELLANEOUS) ×2 IMPLANT
COVER TIP SHEARS 8 DVNC (MISCELLANEOUS) ×1 IMPLANT
COVER TIP SHEARS 8MM DA VINCI (MISCELLANEOUS) ×1
DECANTER SPIKE VIAL GLASS SM (MISCELLANEOUS) ×2 IMPLANT
DRAPE LG THREE QUARTER DISP (DRAPES) ×4 IMPLANT
DRAPE SURG IRRIG POUCH 19X23 (DRAPES) ×2 IMPLANT
DRAPE TABLE BACK 44X90 PK DISP (DRAPES) ×4 IMPLANT
DRAPE UTILITY XL STRL (DRAPES) ×2 IMPLANT
DRSG TEGADERM 6X8 (GAUZE/BANDAGES/DRESSINGS) ×4 IMPLANT
ELECT REM PT RETURN 9FT ADLT (ELECTROSURGICAL) ×2
ELECTRODE REM PT RTRN 9FT ADLT (ELECTROSURGICAL) ×1 IMPLANT
GAUZE VASELINE 3X9 (GAUZE/BANDAGES/DRESSINGS) IMPLANT
GLOVE BIO SURGEON STRL SZ 6.5 (GLOVE) ×7 IMPLANT
GLOVE BIO SURGEON STRL SZ7.5 (GLOVE) ×4 IMPLANT
GLOVE BIOGEL PI IND STRL 7.0 (GLOVE) ×2 IMPLANT
GLOVE BIOGEL PI INDICATOR 7.0 (GLOVE) ×2
GOWN PREVENTION PLUS XLARGE (GOWN DISPOSABLE) ×7 IMPLANT
GOWN STRL NON-REIN LRG LVL3 (GOWN DISPOSABLE) ×4 IMPLANT
HOLDER FOLEY CATH W/STRAP (MISCELLANEOUS) ×2 IMPLANT
KIT ACCESSORY DA VINCI DISP (KITS) ×1
KIT ACCESSORY DVNC DISP (KITS) ×1 IMPLANT
MANIPULATOR UTERINE 4.5 ZUMI (MISCELLANEOUS) ×2 IMPLANT
OCCLUDER COLPOPNEUMO (BALLOONS) ×2 IMPLANT
PACK LAPAROSCOPY W LONG (CUSTOM PROCEDURE TRAY) ×2 IMPLANT
POUCH SPECIMEN RETRIEVAL 10MM (ENDOMECHANICALS) ×3 IMPLANT
SET TUBE IRRIG SUCTION NO TIP (IRRIGATION / IRRIGATOR) ×2 IMPLANT
SHEARS CURVED HARMONIC AC 45CM (MISCELLANEOUS) ×1 IMPLANT
SHEET LAVH (DRAPES) ×2 IMPLANT
SLEEVE XCEL OPT CAN 5 100 (ENDOMECHANICALS) ×1 IMPLANT
SOLUTION ELECTROLUBE (MISCELLANEOUS) ×2 IMPLANT
SPONGE LAP 18X18 X RAY DECT (DISPOSABLE) IMPLANT
STRIP CLOSURE SKIN 1/2X4 (GAUZE/BANDAGES/DRESSINGS) ×2 IMPLANT
SUT VIC AB 0 CT1 27 (SUTURE)
SUT VIC AB 0 CT1 27XBRD ANTBC (SUTURE) ×3 IMPLANT
SUT VIC AB 4-0 PS2 27 (SUTURE) ×4 IMPLANT
SUT VICRYL 0 UR6 27IN ABS (SUTURE) ×3 IMPLANT
SYR 50ML LL SCALE MARK (SYRINGE) ×2 IMPLANT
SYR BULB IRRIGATION 50ML (SYRINGE) IMPLANT
TOWEL OR 17X26 10 PK STRL BLUE (TOWEL DISPOSABLE) ×3 IMPLANT
TOWEL OR NON WOVEN STRL DISP B (DISPOSABLE) ×2 IMPLANT
TRAP SPECIMEN MUCOUS 40CC (MISCELLANEOUS) ×2 IMPLANT
TRAY FOLEY CATH 14FRSI W/METER (CATHETERS) ×2 IMPLANT
TROCAR 12M 150ML BLUNT (TROCAR) ×1 IMPLANT
TROCAR XCEL 12X100 BLDLESS (ENDOMECHANICALS) ×1 IMPLANT
TROCAR XCEL BLADELESS 5X75MML (TROCAR) ×2 IMPLANT
TUBING FILTER THERMOFLATOR (ELECTROSURGICAL) ×2 IMPLANT
WATER STERILE IRR 1500ML POUR (IV SOLUTION) ×4 IMPLANT

## 2012-08-16 NOTE — Anesthesia Postprocedure Evaluation (Signed)
  Anesthesia Post-op Note  Patient: Jasmine Franklin  Procedure(s) Performed: Procedure(s) (LRB): LAPAROSCOPIC BILATERAL SALPINGECTOMY (Bilateral)  Patient Location: PACU  Anesthesia Type: General  Level of Consciousness: awake and alert   Airway and Oxygen Therapy: Patient Spontanous Breathing  Post-op Pain: mild  Post-op Assessment: Post-op Vital signs reviewed, Patient's Cardiovascular Status Stable, Respiratory Function Stable, Patent Airway and No signs of Nausea or vomiting  Last Vitals:  Filed Vitals:   08/16/12 1245  BP: 130/67  Pulse: 69  Temp:   Resp: 8    Post-op Vital Signs: stable   Complications: No apparent anesthesia complications

## 2012-08-16 NOTE — Op Note (Signed)
PATIENT: Jasmine Franklin DATE OF BIRTH: 1970-05-16 ENCOUNTER DATE: 08/16/2012   Preop Diagnosis: Right adnexal mass, elevated OVA-1  Postoperative Diagnosis: tubal endometrioma  Surgery: Laparoscopic bilateral salpingenctomy, lysis of adhesions  Surgeons:  Rejeana Brock A. Duard Brady, MD; Antionette Char, MD   Assistant: None  Anesthesia: General   Estimated blood loss: <25 ml   IVF: 1500 ml   Urine output: 100 ml   Complications: None   Pathology: Bilateral fallopian tubes  Operative findings: Normal ovaries bilaterally. The left ovary was adherent to the left pelvic sidewall. The right fallopian tube was tethered deep in the cul-de-sac with approximately a 5 cm cyst. The rectosigmoid colon was adherent to the right ovary obliterating visualization of the right fallopian tube. There were some adhesions of the omentum to the anterior abdominal wall. Rectosigmoid epiploica were adherent to the left cornua. Frozen section revealed endometrioma involving the right fallopian tube.  Procedure: The patient was identified in the preoperative holding area. Informed consent was signed on the chart. Patient was seen history was reviewed and exam was performed.   The patient was then taken to the operating room and placed in the supine position with SCD hose on. General anesthesia was then induced without difficulty. She was then placed in the dorsolithotomy position. Her arms were tucked at her side with appropriate precautions on the gel pad. Shoulder blocks were then placed in the usual fashion with appropriate precautions. A OG-tube was placed to suction. First timeout was performed to confirm the patient procedure antibiotic allergy status, estimated estimated blood loss and OR time. The perineum was then prepped in the usual fashion with Betadine. A 14 French Foley was inserted into the bladder under sterile conditions. A sterile speculum was placed in the vagina. The cervix was without lesions. The  cervix was grasped with a single-tooth tenaculum. The dilator without difficulty. A ZUMI with a large Koe ring was placed without difficulty. The abdomen was then prepped with 2 Chlor prep sponges per protocol.   Patient was then draped after the prep was dried. Second timeout was performed to confirm the above. After again confirming OG tube placement and it was to suction. A stab-wound was made in left upper quadrant 2 cm below the costal margin on the left in the midclavicular line. A 5 mm operative report was used to assure intra-abdominal placement. The abdomen was insufflated. At this point all points during the procedure the patient's intra-abdominal pressure was not increased over 15 mm of mercury. After insufflation was complete, the patient was placed in deep Trendelenburg position.  At this point the findings of bilateral normal-appearing ovaries were identified. Photographs were taken I went to speak to the patient's family. They were all in agreement (her fianc, father, and mother) that we should not take the ovaries as they both appeared to be normal. The decision at that time was to proceed with lysis of adhesions and if there's any abnormalities found at that time to proceed with conservative management.  I then returned to the operating room and scrubbed back in.  A 5 mm umbilical port was placed as well as a 5 mm mid-abdominal left port.  These ports replaced in direct visualization. Using monopolar cautery the adhesions of the left ovary to the pelvic sidewall were taken down. The fallopian tube was adherent to the ovary and these adhesions were taken down using monopolar cautery. Using the harmonic scalpel a salpingectomy on the left side performed. The ovary and in the ovarian blood  supply was left intact.  The adhesions of the omentum to the anterior abdominal wall were taken down with sharp monopolar cautery. The adhesions of the epiploica to the cornua were similarly taken down with  monopolar cautery. The adhesions of rectosigmoid colon to the right adnexa were taken down. At this point his winter deep in the cul-de-sac were able to identify the right fallopian tube. Was elevated at the cul-de-sac and there appeared to be approximately 4-5 cm cystic mass that obliterated the fimbria of the right fallopian tube. The fallopian tube was brought out of the cul de sac. The fallopian tube was then amputated along its meso aspect with the harmonic scalpel. A 5 mm infraumbilical incision was converted to a 10/12 incision. An Endo catch bag was placed the 10/12 incision. The fallopian tube was placed with an Endo catch bag and delivered through the 10-12 port site.  Fallopian tube was sent for frozen section. Abdominal survey revealed no other T. disease or lesions. Frozen section returned as endometrioma with no evidence of atypia. The pneumoperitoneum was removed. The ports were removed under direct visualization and the pneumo-was removed from the patient's abdomen. The fascia of the 10-12 umbilical port fascia was reapproximated with 2 interrupted sutures of 0 Vicryl on a UR 6. The skin was closed using 4-0 Vicryl. Steri-Strips were placed. The Koe ring and uterine manipulator were removed from the cervix. The cervix was swabbed and noted to be hemostatic.  All instrument needle and Ray-Tec counts were correct x2. The patient tolerated the procedure well and was taken to the recovery room in stable condition. This is [facility] dictating an operative note on patient Jasmine Franklin.

## 2012-08-16 NOTE — Preoperative (Signed)
Beta Blockers   Reason not to administer Beta Blockers:Not Applicable, not on home BB 

## 2012-08-16 NOTE — Transfer of Care (Signed)
Immediate Anesthesia Transfer of Care Note  Patient: Jasmine Franklin  Procedure(s) Performed: Procedure(s): LAPAROSCOPIC BILATERAL SALPINGECTOMY (Bilateral)  Patient Location: PACU  Anesthesia Type:General  Level of Consciousness: awake, patient cooperative and responds to stimulation, drowsy, ventilating well, easily awoken  Airway & Oxygen Therapy: Patient Spontanous Breathing and Patient connected to face mask oxygen  Post-op Assessment: Report given to PACU RN, Post -op Vital signs reviewed and stable and Patient moving all extremities X 4  Post vital signs: Reviewed and stable  Complications: No apparent anesthesia complications

## 2012-08-16 NOTE — Anesthesia Preprocedure Evaluation (Addendum)
Anesthesia Evaluation  Patient identified by MRN, date of birth, ID band Patient awake    Reviewed: Allergy & Precautions, H&P , NPO status , Patient's Chart, lab work & pertinent test results  Airway Mallampati: II TM Distance: >3 FB Neck ROM: full    Dental no notable dental hx. (+) Teeth Intact and Dental Advisory Given   Pulmonary neg pulmonary ROS,  breath sounds clear to auscultation  Pulmonary exam normal       Cardiovascular Exercise Tolerance: Good negative cardio ROS  Rhythm:regular Rate:Normal     Neuro/Psych negative neurological ROS  negative psych ROS   GI/Hepatic negative GI ROS, Neg liver ROS,   Endo/Other  negative endocrine ROS  Renal/GU negative Renal ROS  negative genitourinary   Musculoskeletal   Abdominal Normal abdominal exam  (+)   Peds  Hematology negative hematology ROS (+)   Anesthesia Other Findings   Reproductive/Obstetrics negative OB ROS                           Anesthesia Physical Anesthesia Plan  ASA: I  Anesthesia Plan: General   Post-op Pain Management:    Induction: Intravenous  Airway Management Planned: Oral ETT  Additional Equipment:   Intra-op Plan:   Post-operative Plan: Extubation in OR  Informed Consent: I have reviewed the patients History and Physical, chart, labs and discussed the procedure including the risks, benefits and alternatives for the proposed anesthesia with the patient or authorized representative who has indicated his/her understanding and acceptance.   Dental Advisory Given  Plan Discussed with: CRNA and Surgeon  Anesthesia Plan Comments:         Anesthesia Quick Evaluation  

## 2012-08-16 NOTE — H&P (View-Only) (Signed)
Consult Note: Gyn-Onc  Jasmine Franklin 42 y.o. female  CC:  Chief Complaint  Patient presents with  . Ovarian mass    New Consult    HPI:  Patient is seen in consultation today at the request of Dr. Reynaldo Minium. Patient is a 42 year old gravida 5 para 5 who had new onset of abdominal pain 2 days prior to presenting to the emergency room at the end of March. She states her pain is primarily in her back and she used some over-the-counter medication that helped her discomfort.  The pain then removed from her back to her side and she went to the emergency room with severe abdominal pain.  In the emergency room she underwent a CT scan as it was felt that she most likely had a renal stone.  CT scan revealed:  Findings: Limited images through the lung bases demonstrate no significant appreciable abnormality. The heart size is within normal limits. No pleural or pericardial effusion. Organ abnormality/lesion detection is limited in the absence of intravenous contrast. Within this limitation, left hepatic lobe cyst. Unremarkable biliary system, spleen, pancreas, adrenal glands. Symmetric renal size. No hydronephrosis or hydroureter. Unable to follow the ureteral course in their entirety due to the decompressed state. No definite ureteral stones. No CT evidence for colitis. Normal appendix. Small bowel loops are normal course and caliber. No free intraperitoneal air or fluid. No lymphadenopathy.  Normal caliber aorta and branch vessels. Nonspecific right adnexal cyst measuring up to seven centimeters. Partially decompressed bladder. No acute osseous finding. Multilevel degenerative change/facet arthropathy, mild.   IMPRESSION:  No acute abdominopelvic process identified.  No hydronephrosis or hydroureter. Unable to follow the ureters in their entirety due to decompressed state.  7 cm right adnexal cyst. Recommend a 4-6 week pelvic ultrasound follow-up to document resolution.  She was seen by Dr.  Lily Peer in the office and had an ultrasound. The uterus measures 11.1 x 8 x 6.6 cm with endometrial stripe of 1920 mm. It was anteverted and the pelvic mass. The right ovary was seen and per the fundus of the uterus with an echo free cyst measuring 2.9 x 2.8 x 2.3 cm. The right adnexal mass that it will mention of 9.9 x 6.2 x 8 cm with a cystic and solid area. There is a solid nodule measuring 7 mm along a thin septation. The left ovary was normal. The patient had an ova 1 tumor marker was 5.1 with the upper limit of normal being 5 and she was subsequently referred to [place] for evaluation.  She states her pain is currently a 3-4. She is using tramadol and ibuprofen. She's able to work. She notices increased discomfort with bowel movements. She does have some increased pain when bending or standing for prolonged period of time. She had her cycle last week he was regular but a little bit heavier than usual as was her cycle in March. She has a change about bladder habits. She denies any nausea or vomiting. She denies any early satiety.  She stay she's due for a Pap smear as well as was of 16 months ago. She's never had an abnormal Pap smear. Her mammogram is due.   Current Meds:  Outpatient Encounter Prescriptions as of 07/28/2012  Medication Sig Dispense Refill  . HYDROcodone-acetaminophen (NORCO/VICODIN) 5-325 MG per tablet Take 2 tablets by mouth every 4 (four) hours as needed for pain.  15 tablet  0  . ibuprofen (ADVIL,MOTRIN) 800 MG tablet Take 1 tablet (800 mg total)  by mouth 3 (three) times daily.  30 tablet  1  . traMADol (ULTRAM) 50 MG tablet Take 1 tablet (50 mg total) by mouth every 6 (six) hours as needed for pain.  30 tablet  0   No facility-administered encounter medications on file as of 07/28/2012.    Allergy: No Known Allergies  Social Hx:   History   Social History  . Marital Status: Single    Spouse Name: N/A    Number of Children: N/A  . Years of Education: N/A    Occupational History  . Not on file.   Social History Main Topics  . Smoking status: Never Smoker   . Smokeless tobacco: Never Used  . Alcohol Use: No  . Drug Use: No  . Sexually Active: Yes    Birth Control/ Protection: None   Other Topics Concern  . Not on file   Social History Narrative  . No narrative on file    Past Surgical Hx:  Past Surgical History  Procedure Laterality Date  . Cesarean section    . Tubal ligation      Past Medical Hx:  Past Medical History  Diagnosis Date  . Slipped cervical disc 2011    Family Hx: No family history on file.  Vitals:  Blood pressure 128/90, pulse 66, temperature 98.2 F (36.8 C), temperature source Oral, resp. rate 18, height 5\' 7"  (1.702 m), weight 210 lb 6.4 oz (95.437 kg), last menstrual period 07/18/2012.  Physical Exam:  Well-nourished well-developed female in no acute distress.  Neck: Supple, no lymphadenopathy, no thyromegaly.  Lungs: Clear to auscultation bilaterally.  Cardiovascular: Regular rate and rhythm.  Abdomen: Soft, nontender, nondistended. No palpable masses or hepatosplenomegaly.  Groins: No lymphadenopathy.  Extremities no edema.  Pelvic: External genitalia within normal limits. Vagina is a physiologic discharge. The cervix is multiparous. ThinPrep Pap smear without difficulty. Bimanual examination does reveal some tenderness. There is about a 12 cm mass encompasses the uterus and the mass they are mobile together. There is no nodularity appreciated. It appears to be arising on the right side with some component within the cul-de-sac.  Assessment/Plan: 42 year old with complex right adnexal mass in minimally elevated OVA 1 tumor marker. I discussed proceeding with surgery with the patient. She wishes to proceed with a right salpingo-oophorectomy. The mass to be sent for frozen section. She understands if the mass is benign and should there be no pathology involving her cervix at the time of this  Pap smear that we would proceed with a left salpingectomy conclude the procedure. She understands if the mass is malignant we have to proceed with a completion hysterectomy and appropriate staging. She would like for [place] to proceed in a minimally invasive fashion. I believe that a robotic assistance with the ideal should we have to stage her it provides for ease in periaortic nodal dissection and she is amenable to this. We will tentatively schedule her surgery for April 29. We'll contact Dr. Lily Peer to coordinate schedules.  She will need for preoperative visit. Her questions were elicited in answer to her satisfaction. She has my card and will, with any questions. She is very satisfied with her visit today to  [facility] A., MD 07/28/2012, 2:56 PM

## 2012-08-16 NOTE — Interval H&P Note (Signed)
History and Physical Interval Note:  08/16/2012 9:59 AM  Jasmine Franklin  has presented today for surgery, with the diagnosis of RIGHT OVARIAN MASS  The various methods of treatment have been discussed with the patient and family. After consideration of risks, benefits and other options for treatment, the patient has consented to  Procedure(s): ROBOTIC ASSISTED RIGHT SALPINGO OOPHORECTOMY, LEFT SALPINGECTOMY, POSSIBLE HYSTERECTOMY, BSO AND STAGING (Bilateral) as a surgical intervention .  The patient's history has been reviewed, patient examined, no change in status, stable for surgery.  I have reviewed the patient's chart and labs.  Questions were answered to the patient's satisfaction.     Earon Rivest A.

## 2012-08-17 ENCOUNTER — Encounter: Payer: Self-pay | Admitting: Gynecologic Oncology

## 2012-08-17 ENCOUNTER — Encounter (HOSPITAL_COMMUNITY): Payer: Self-pay | Admitting: Gynecologic Oncology

## 2012-08-18 ENCOUNTER — Telehealth: Payer: Self-pay | Admitting: Gynecologic Oncology

## 2012-08-18 NOTE — Telephone Encounter (Signed)
Attempted to leave message for patient about final pathology results.  Will re-attempt at a later time.

## 2012-08-18 NOTE — Telephone Encounter (Addendum)
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  Follow up appointment given for Sep 01, 2012.  Instructed to call for any questions or concerns.  Patient describes expected post operative status.  Adequate PO intake reported.  Bowels and bladder functioning without difficulty.  Pain minimal.  Reportable signs and symptoms reviewed.

## 2012-08-23 ENCOUNTER — Other Ambulatory Visit: Payer: Self-pay | Admitting: Gynecologic Oncology

## 2012-08-23 MED ORDER — TRAMADOL HCL 50 MG PO TABS: 50.0000 mg | ORAL_TABLET | Freq: Four times a day (QID) | ORAL | Status: DC | PRN

## 2012-08-23 NOTE — Progress Notes (Signed)
Patient called more pain medication to use for moderate post-operative pain at nighttime.  She states that she feels the percocet are too strong and is requesting something milder.  She has used tramadol in the past and tolerated it well.  Instructed that Tramadol would be sent to her pharmacy and to use only as needed for severe pain.  Instructed that she needs to be transitioning to ibuprofen for pain.  Instructed to call for any questions or concerns.

## 2012-08-29 ENCOUNTER — Telehealth: Payer: Self-pay | Admitting: Gynecologic Oncology

## 2012-08-29 NOTE — Telephone Encounter (Signed)
Post Op Note: Gyn-Onc  Jasmine Franklin 42 y.o. female  CC:    Assessment/Plan: 42 year old initially presented with a right complex adnexal mass.  Bilateral salpingectomy performed 08/16/2012 pathology c/w bilateral endometriotic cysts.  F/U with Dr. Lily Peer.   HPI: Patient is a 34 year old gravida 5 para 5 who had new onset of abdominal pain 2 days prior to presenting to the emergency room at the end of March. with severe abdominal pain.  In the emergency room she underwent a CT scan as it was felt that she most likely had a renal stone.  CT scan revealed: Findings:  IMPRESSION:  No acute abdominopelvic process identified.  No hydronephrosis or hydroureter. Unable to follow the ureters in their entirety due to decompressed state. 7 cm right adnexal cyst. Recommend a 4-6 week pelvic ultrasound follow-up to document resolution.  She was seen by Dr. Lily Peer in the office and had an ultrasound. The uterus measures 11.1 x 8 x 6.6 cm with endometrial stripe of 1920 mm. It was anteverted and the pelvic mass. The right ovary was seen and per the fundus of the uterus with an echo free cyst measuring 2.9 x 2.8 x 2.3 cm. The right adnexal mass that it will mention of 9.9 x 6.2 x 8 cm with a cystic and solid area. There is a solid nodule measuring 7 mm along a thin septation. The left ovary was normal. The patient had an ova 1 tumor marker was 5.1 with the upper limit of normal being 5 and she was subsequently referred to [place] for evaluation. On 4/29 she underwent B salpingectomy  1. Fallopian tube, right - BENIGN FALLOPIAN TUBAL TISSUE WITH ENDOMETRIOTIC CYST, NO ATYPIA OR MALIGNANCY. 2. Fallopian tube, left - BENIGN FALLOPIAN TUBAL TISSUE WITH ENDOMETRIOTIC CYST, NO ATYPIA OR MALIGNANCY.  Current Meds:  Outpatient Encounter Prescriptions as of 08/29/2012  Medication Sig Dispense Refill  . ibuprofen (ADVIL,MOTRIN) 800 MG tablet TAKE 1 TABLET (800 MG TOTAL) BY MOUTH 3 (THREE) TIMES DAILY.  30  tablet  2  . oxyCODONE-acetaminophen (PERCOCET) 5-325 MG per tablet Take 2 tablets by mouth every 6 (six) hours as needed for pain.  30 tablet  0  . traMADol (ULTRAM) 50 MG tablet Take 1 tablet (50 mg total) by mouth every 6 (six) hours as needed for pain.  15 tablet  0   No facility-administered encounter medications on file as of 08/29/2012.    Allergy: No Known Allergies  Social Hx:   History   Social History  . Marital Status: Single    Spouse Name: N/A    Number of Children: N/A  . Years of Education: N/A   Occupational History  . Not on file.   Social History Main Topics  . Smoking status: Never Smoker   . Smokeless tobacco: Never Used  . Alcohol Use: No  . Drug Use: No  . Sexually Active: Yes    Birth Control/ Protection: None   Other Topics Concern  . Not on file   Social History Narrative  . No narrative on file    Past Surgical Hx:  Past Surgical History  Procedure Laterality Date  . Cesarean section    . Tubal ligation    . Laparoscopic bilateral salpingectomy Bilateral 08/16/2012    Procedure: LAPAROSCOPIC BILATERAL SALPINGECTOMY;  Surgeon: Rejeana Brock A. Duard Brady, MD;  Location: [facility];  Service: Gynecology;  Laterality: Bilateral;    Past Medical Hx:  Past Medical History  Diagnosis Date  . Slipped cervical disc 2011  pt did physical therapy- no longer having neck or left arm pain    Family Hx: No family history on file.  Vitals:  Blood pressure 128/90, pulse 66, temperature 98.2 F (36.8 C), temperature source Oral, resp. rate 18, height 5\' 7"  (1.702 m), weight 210 lb 6.4 oz (95.437 kg), last menstrual period 07/18/2012.  Physical Exam:  Well-nourished well-developed female in no acute distress.  Neck: Supple, no lymphadenopathy, no thyromegaly.  Lungs: Clear to auscultation bilaterally.  Cardiovascular: Regular rate and rhythm.  Abdomen: Soft, nontender, nondistended. No palpable masses or hepatosplenomegaly.  Groins: No  lymphadenopathy.  Extremities no edema.  Pelvic: External genitalia within normal limits. Vagina is a physiologic discharge. The cervix is multiparous.      Jasmine Schimke, MD 08/29/2012, 8:44 PM

## 2012-09-01 ENCOUNTER — Ambulatory Visit: Payer: 59 | Attending: Gynecologic Oncology | Admitting: Gynecologic Oncology

## 2012-09-01 ENCOUNTER — Encounter: Payer: Self-pay | Admitting: Gynecologic Oncology

## 2012-09-01 VITALS — BP 140/94 | HR 70 | Temp 99.7°F | Resp 16 | Ht 64.65 in | Wt 211.9 lb

## 2012-09-01 DIAGNOSIS — N838 Other noninflammatory disorders of ovary, fallopian tube and broad ligament: Secondary | ICD-10-CM

## 2012-09-01 DIAGNOSIS — Z09 Encounter for follow-up examination after completed treatment for conditions other than malignant neoplasm: Secondary | ICD-10-CM | POA: Insufficient documentation

## 2012-09-01 DIAGNOSIS — N802 Endometriosis of fallopian tube: Secondary | ICD-10-CM

## 2012-09-01 DIAGNOSIS — Z9079 Acquired absence of other genital organ(s): Secondary | ICD-10-CM | POA: Insufficient documentation

## 2012-09-01 NOTE — Progress Notes (Signed)
Post Op Note: Gyn-Onc  Avonlea L Tinsley 42 y.o. female  CC:  Post op check   Assessment/Plan: 42 year old initially presented with a right complex adnexal mass. Intraoperatively the ovaries were noted to be normal and the pathology was limited to the tubes. Bilateral salpingectomy performed 08/16/2012 pathology c/w bilateral endometriotic cysts.  Patient has done well post op.  Patient informed that she does not need to use birth control.    F/U with Dr. Lily Peer in 8 months   HPI: Patient is a 69 year old gravida 5 para 5 who had new onset of abdominal pain 2 days prior to presenting to the emergency room at the end of March. with severe abdominal pain.  In the emergency room she underwent a CT scan as it was felt that she most likely had a renal stone.  CT scan revealed:No acute abdominopelvic process identified. No hydronephrosis or hydroureter. Unable to follow the ureters in their entirety due to decompressed state. 7 cm right adnexal cyst. Recommend a 4-6 week pelvic ultrasound follow-up to document resolution.  She was seen by Dr. Lily Peer in the office and had an ultrasound. The uterus measures 11.1 x 8 x 6.6 cm with endometrial stripe of 1920 mm. It was anteverted and the pelvic mass. The right ovary was seen and per the fundus of the uterus with an echo free cyst measuring 2.9 x 2.8 x 2.3 cm. The right adnexal mass that it will mention of 9.9 x 6.2 x 8 cm with a cystic and solid area. There is a solid nodule measuring 7 mm along a thin septation. The left ovary was normal. The patient had an ova 1 tumor marker was 5.1 with the upper limit of normal being 5   On 4/29 she underwent B salpingectomy.  Operative findings notable for normal appearing ovaries.    1. Fallopian tube, right - BENIGN FALLOPIAN TUBAL TISSUE WITH ENDOMETRIOTIC CYST, NO ATYPIA OR MALIGNANCY. 2. Fallopian tube, left - BENIGN FALLOPIAN TUBAL TISSUE WITH ENDOMETRIOTIC CYST, NO ATYPIA OR MALIGNANCY.   Social  Hx:   History   Social History  . Marital Status: Single    Spouse Name: N/A    Number of Children: N/A  . Years of Education: N/A   Occupational History  . Not on file.   Social History Main Topics  . Smoking status: Never Smoker   . Smokeless tobacco: Never Used  . Alcohol Use: No  . Drug Use: No  . Sexually Active: Yes    Birth Control/ Protection: None   Other Topics Concern  . Not on file   Social History Narrative  . No narrative on file    Past Surgical Hx:  Past Surgical History  Procedure Laterality Date  . Cesarean section    . Tubal ligation    . Laparoscopic bilateral salpingectomy Bilateral 08/16/2012    Procedure: LAPAROSCOPIC BILATERAL SALPINGECTOMY;  Surgeon: Rejeana Brock A. Duard Brady, MD;  Location: [facility];  Service: Gynecology;  Laterality: Bilateral;    Past Medical Hx:  Past Medical History  Diagnosis Date  . Slipped cervical disc 2011    pt did physical therapy- no longer having neck or left arm pain    Family Hx: No family history on file.  Vitals:  Blood pressure 140/94, pulse 70, temperature 99.7 F (37.6 C), temperature source Oral, resp. rate 16, height 5' 4.65" (1.642 m), weight 211 lb 14.4 oz (96.117 kg).  Physical Exam:  Well-nourished well-developed female in no acute distress.  Lungs: Clear to  auscultation bilaterally.  Cardiovascular: Regular rate and rhythm.  Abdomen: Soft, nontender, nondistended. No palpable masses or hepatosplenomegaly.  Incision sites well healed without hernia  Extremities no edema.  Pelvic: External genitalia within normal limits. No cul de sac masses    Laurette Schimke, MD 09/01/2012, 10:38 AM

## 2012-09-01 NOTE — Patient Instructions (Addendum)
F/U with Dr. Lily Peer  Thank you very much Ms. Stevey Cala Bradford for allowing me to provide care for you today.  I appreciate your confidence in choosing our Gynecologic Oncology team.  If you have any questions about your visit today please call our office and we will get back to you as soon as possible.  Maryclare Labrador. Haili Donofrio MD., PhD Gynecologic Oncology

## 2013-02-23 ENCOUNTER — Other Ambulatory Visit: Payer: Self-pay

## 2014-02-19 ENCOUNTER — Encounter: Payer: Self-pay | Admitting: Gynecologic Oncology

## 2014-09-19 IMAGING — CR DG CHEST 2V
2 series · 2 of 2 positions shown · non-contrast
Comparison: None

CLINICAL DATA: Preop for pelvic mass surgery.

CHEST - 2 VIEW

[view not recorded (1 of 2)]
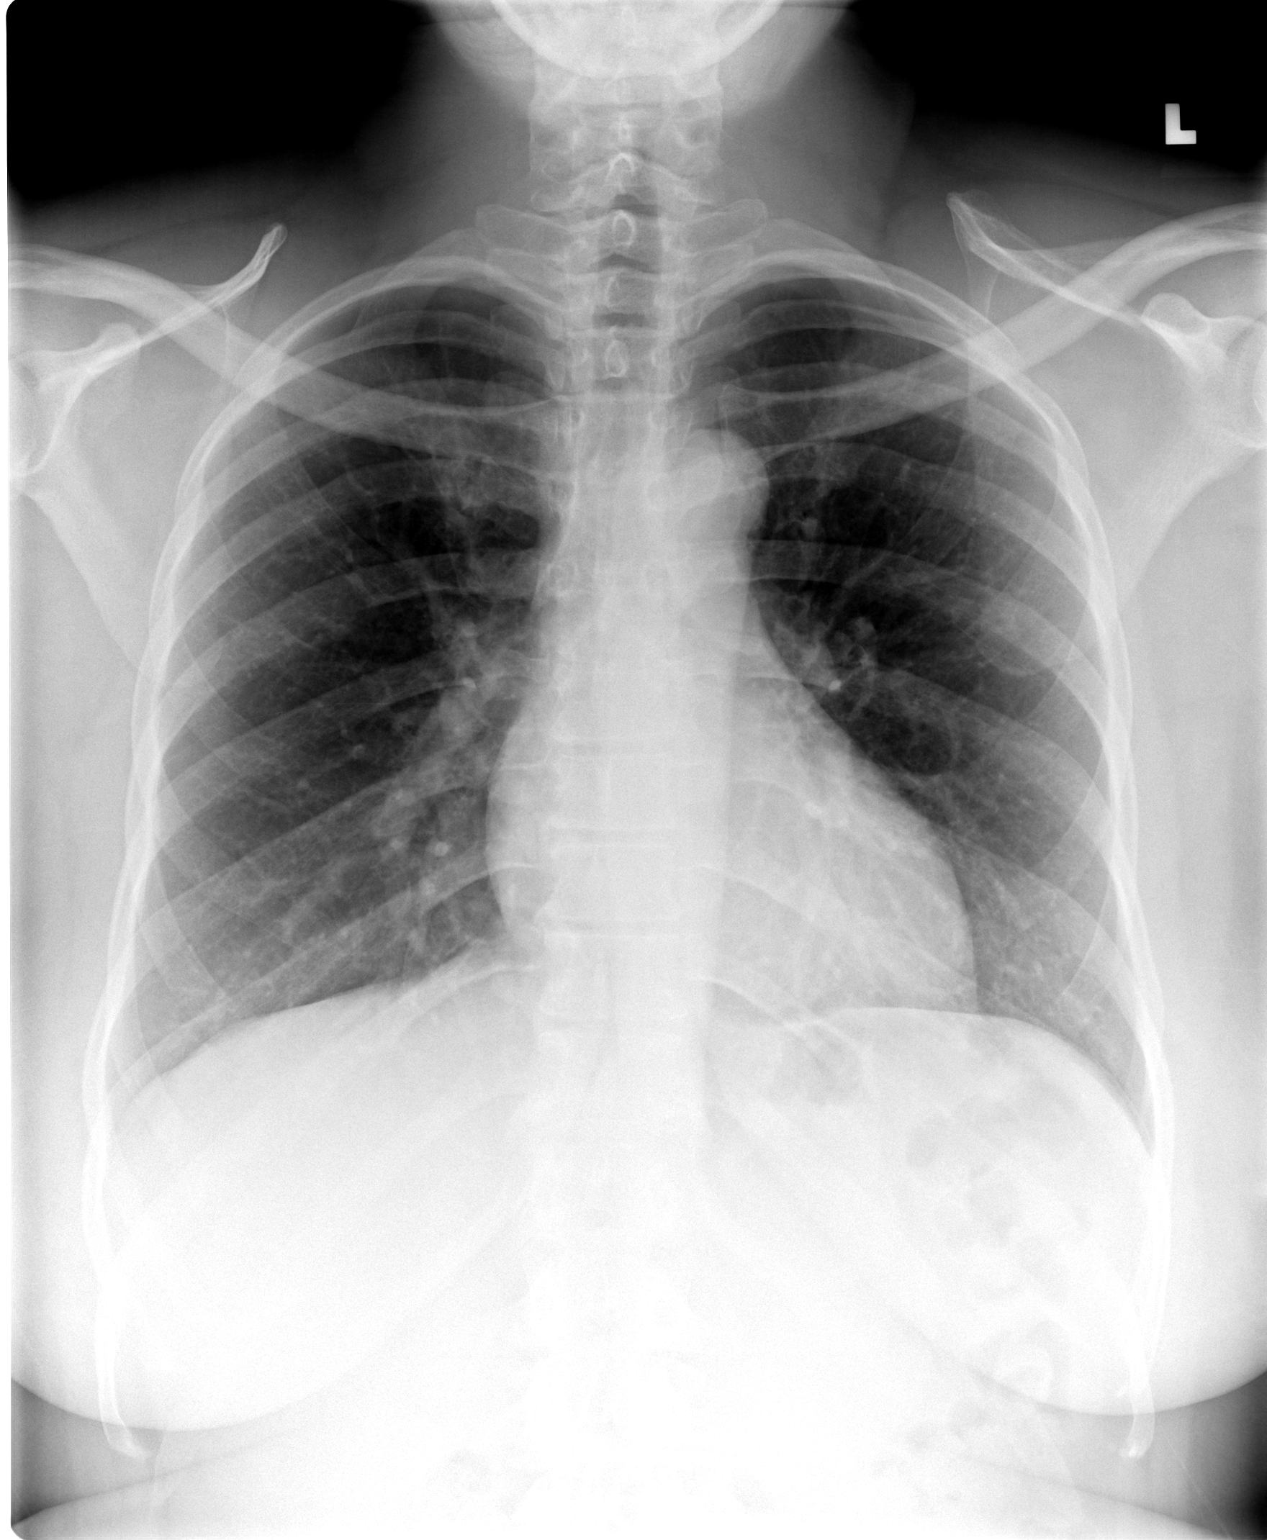

[view not recorded (2 of 2)]
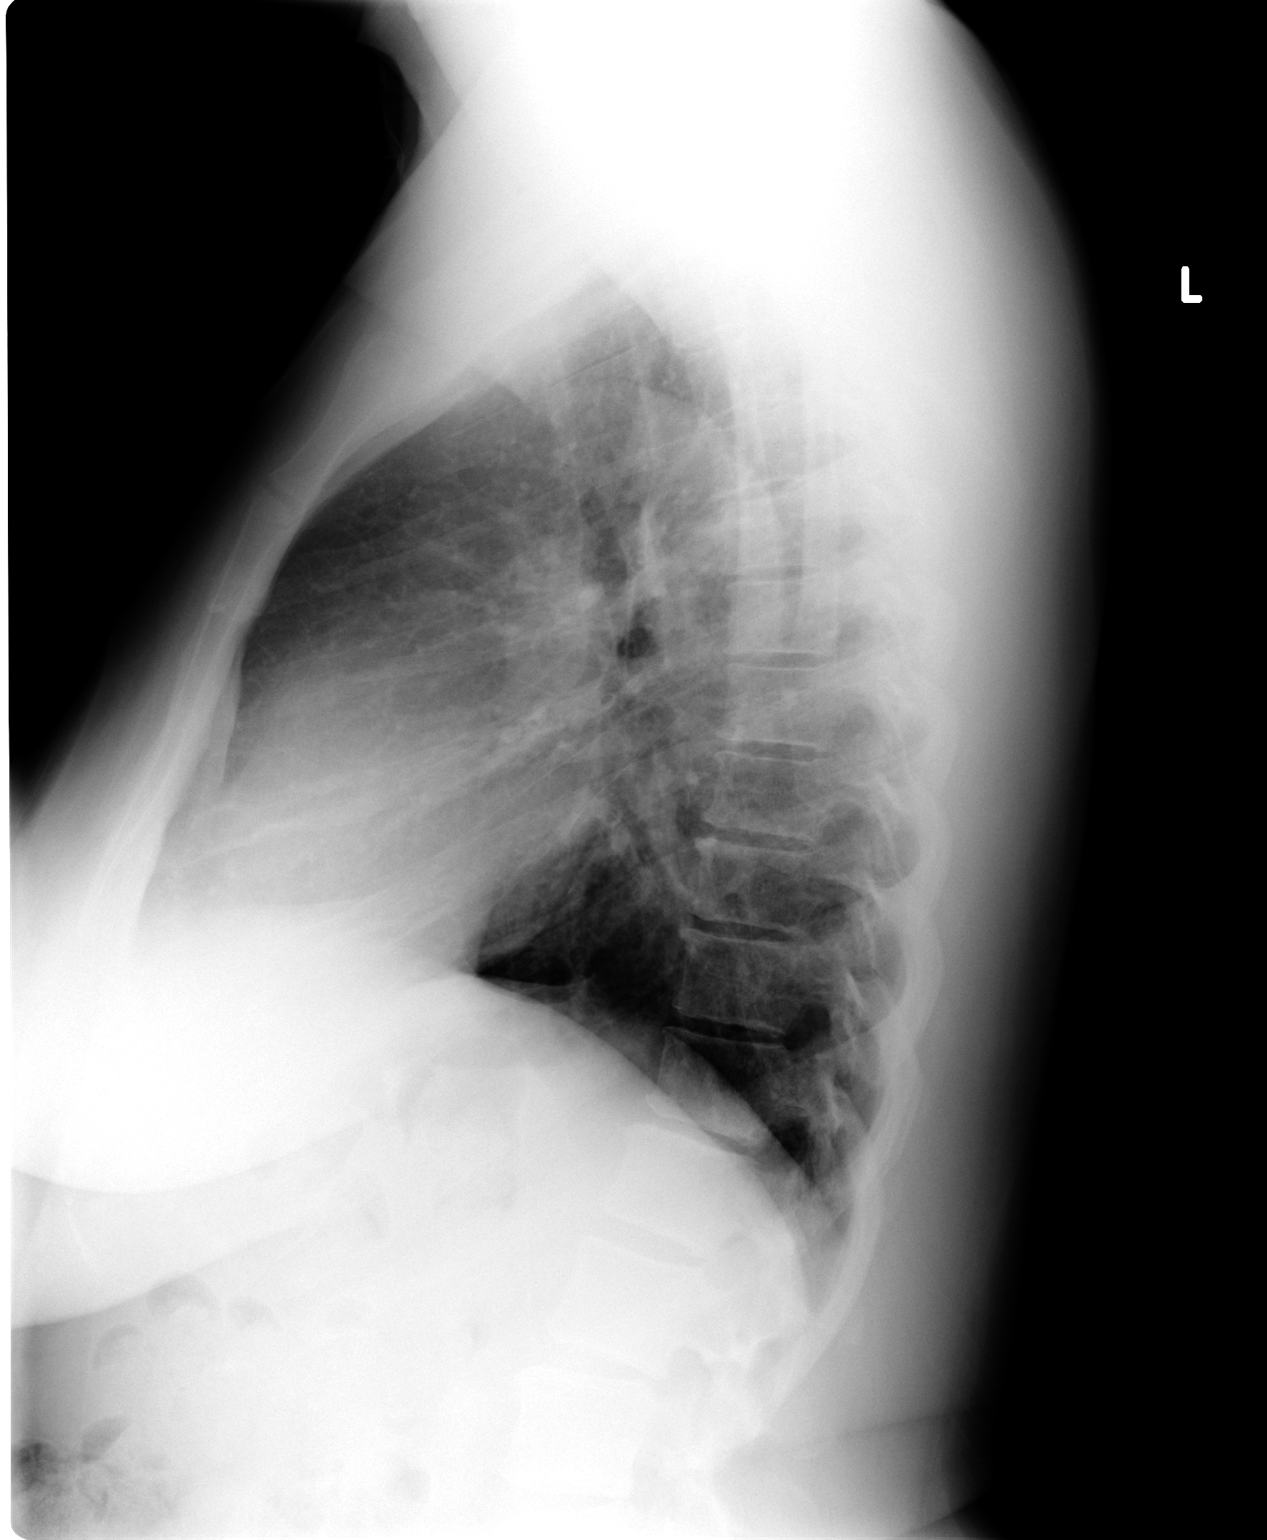

[2 of 2 positions shown; findings below may reference images not displayed]

FINDINGS: Cardiac silhouette, mediastinal and hilar contours are
normal.  The lungs are clear.  No pleural effusion.  The bony
thorax is intact.
IMPRESSION: No acute cardiopulmonary findings.

## 2016-09-11 ENCOUNTER — Encounter: Payer: Self-pay | Admitting: Physician Assistant

## 2016-09-11 ENCOUNTER — Ambulatory Visit (INDEPENDENT_AMBULATORY_CARE_PROVIDER_SITE_OTHER): Payer: Commercial Managed Care - PPO | Admitting: Physician Assistant

## 2016-09-11 ENCOUNTER — Ambulatory Visit (INDEPENDENT_AMBULATORY_CARE_PROVIDER_SITE_OTHER): Payer: Commercial Managed Care - PPO

## 2016-09-11 VITALS — BP 160/100 | HR 94 | Temp 98.3°F | Resp 16 | Ht 65.0 in | Wt 216.8 lb

## 2016-09-11 DIAGNOSIS — M545 Low back pain, unspecified: Secondary | ICD-10-CM

## 2016-09-11 DIAGNOSIS — M533 Sacrococcygeal disorders, not elsewhere classified: Secondary | ICD-10-CM | POA: Diagnosis not present

## 2016-09-11 MED ORDER — CYCLOBENZAPRINE HCL 10 MG PO TABS: 10.0000 mg | ORAL_TABLET | Freq: Three times a day (TID) | ORAL | 0 refills | Status: DC | PRN

## 2016-09-11 MED ORDER — MELOXICAM 15 MG PO TABS: 15.0000 mg | ORAL_TABLET | Freq: Every day | ORAL | 0 refills | Status: DC

## 2016-09-11 NOTE — Progress Notes (Signed)
PRIMARY [facility]ARE AT [place], [place] [address] [phone]  Date:  09/11/2016   Name:  Jasmine Franklin   DOB:  01-10-1971   MRN:  [id number]  PCP:  Patient, No Pcp Per    History of Present Illness:  Jasmine Franklin is a 46 y.o. female patient who presents to PCP with  Chief Complaint  Patient presents with  . Pain    per patient, low back pain for appx 1 week and numbness to L leg, began this morning     She has progressively worsened low back pain for 1 week.  This morning, she woke with some numbness on  The lateral side of her leg pain.  She recalls cutting grass, and afterward she had a ache at her lower back pain.  there is some side pain.  Husband stated that 5 days ago it appeared like her feet were swollen, however she disagrees.   She notes that she had reports of elevated BP during procedures and pain.   No chest pains, palpitations, sob, or leg swellings.  Patient Active Problem List   Diagnosis Date Noted  . Endometriosis of fallopian tube 09/01/2012  . ELEVATED BLOOD PRESSURE WITHOUT DIAGNOSIS OF HYPERTENSION 12/10/2009  . HERNIATED CERVICAL DISC 10/11/2009  . NECK PAIN, LEFT 10/11/2009  . CERVICAL RADICULOPATHY, LEFT 10/11/2009    Past Medical History:  Diagnosis Date  . Slipped cervical disc 2011   pt did physical therapy- no longer having neck or left arm pain    Past Surgical History:  Procedure Laterality Date  . CESAREAN SECTION    . LAPAROSCOPIC BILATERAL SALPINGECTOMY Bilateral 08/16/2012   Procedure: LAPAROSCOPIC BILATERAL SALPINGECTOMY;  Surgeon: Imagene Gurney A. Alycia Rossetti, MD;  Location: [facility];  Service: Gynecology;  Laterality: Bilateral;  . TUBAL LIGATION      Social History  Substance Use Topics  . Smoking status: Never Smoker  . Smokeless tobacco: Never Used  . Alcohol use No    Family History  Problem Relation Age of Onset  . Cancer Father        lymphoma'  . Diabetes Brother   . Hypertension Paternal Grandmother   .  Hypertension Paternal Grandfather     No Known Allergies  Medication list has been reviewed and updated.  Current Outpatient Prescriptions on File Prior to Visit  Medication Sig Dispense Refill  . ibuprofen (ADVIL,MOTRIN) 800 MG tablet TAKE 1 TABLET (800 MG TOTAL) BY MOUTH 3 (THREE) TIMES DAILY. (Patient not taking: Reported on 09/11/2016) 30 tablet 2  . oxyCODONE-acetaminophen (PERCOCET) 5-325 MG per tablet Take 2 tablets by mouth every 6 (six) hours as needed for pain. (Patient not taking: Reported on 09/11/2016) 30 tablet 0  . traMADol (ULTRAM) 50 MG tablet Take 1 tablet (50 mg total) by mouth every 6 (six) hours as needed for pain. (Patient not taking: Reported on 09/11/2016) 15 tablet 0   No current facility-administered medications on file prior to visit.     ROS ROS otherwise unremarkable unless listed above.  Physical Examination: BP (!) 179/117 (BP Location: Right Arm, Patient Position: Sitting, Cuff Size: Large)   Pulse (!) 102   Temp 98.3 F (36.8 C) (Oral)   Resp 16   Ht 5\' 5"  (1.651 m)   Wt 216 lb 12.8 oz (98.3 kg)   LMP 09/01/2016   SpO2 98%   BMI 36.08 kg/m  Ideal Body Weight: Weight in (lb) to have BMI = 25: 149.9  Physical Exam  Constitutional: She  is oriented to person, place, and time. She appears well-developed and well-nourished. No distress.  HENT:  Head: Normocephalic and atraumatic.  Right Ear: External ear normal.  Left Ear: External ear normal.  Eyes: Conjunctivae and EOM are normal. Pupils are equal, round, and reactive to light.  Cardiovascular: Normal rate.   Pulmonary/Chest: Effort normal. No respiratory distress.  Musculoskeletal:       Lumbar back: She exhibits bony tenderness (tender along the si joint bilaterally).  Neurological: She is alert and oriented to person, place, and time.  Skin: She is not diaphoretic.  Psychiatric: She has a normal mood and affect. Her behavior is normal.    Dg Si Joints  Result Date: 09/11/2016 CLINICAL  DATA:  SI joint tenderness.  Pain.  No known injury . EXAM: BILATERAL SACROILIAC JOINTS - 3+ VIEW COMPARISON:  No prior. FINDINGS: No evidence of acute bony abnormality or erosive arthropathy. Mild sclerotic changes about the right SI joint cannot be completely excluded. These changes may be degenerative. Mild degenerative changes lumbar spine. AP view of the pelvis may prove useful for complete evaluation. Pelvic calcifications noted consistent with phleboliths. IMPRESSION: 1. No evidence of acute bony abnormality or erosive arthropathy. 2. Mild sclerotic changes noted about the right SI joint, possibly secondary to degenerative change. Mild degenerative changes lumbar spine . Electronically Signed   By: Marcello Moores  Register   On: 09/11/2016 09:09    Assessment and Plan: Jasmine Franklin is a 46 y.o. female who is here today for cc of low back pain appears to be sacroiliitis.  NSAID and muscle relaxer.  Advised icing, and stretches. Acute bilateral low back pain without sciatica - Plan: DG Si Joints, meloxicam (MOBIC) 15 MG tablet  Jasmine Drape, PA-C [facility] 5/27/20186:54 PM

## 2016-09-11 NOTE — Patient Instructions (Addendum)
Please ice the area three times per day for 15 minutes I would like you to rest it. Make sure you are wearing proper footwear at this time Take the mobic.  Do not use naproxen or ibuprofen at this time.   Sacroiliac Joint Dysfunction Sacroiliac joint dysfunction is a condition that causes inflammation on one or both sides of the sacroiliac (SI) joint. The SI joint connects the lower part of the spine (sacrum) with the two upper portions of the pelvis (ilium). This condition causes deep aching or burning pain in the low back. In some cases, the pain may also spread into one or both buttocks or hips or spread down the legs. What are the causes? This condition may be caused by:  Pregnancy. During pregnancy, extra stress is put on the SI joints because the pelvis widens.  Injury, such as:  Car accidents.  Sport-related injuries.  Work-related injuries.  Having one leg that is shorter than the other.  Conditions that affect the joints, such as:  Rheumatoid arthritis.  Gout.  Psoriatic arthritis.  Joint infection (septic arthritis). Sometimes, the cause of SI joint dysfunction is not known. What are the signs or symptoms? Symptoms of this condition include:  Aching or burning pain in the lower back. The pain may also spread to other areas, such as:  Buttocks.  Groin.  Thighs and legs.  Muscle spasms in or around the painful areas.  Increased pain when standing, walking, running, stair climbing, bending, or lifting. How is this diagnosed? Your health care provider will do a physical exam and take your medical history. During the exam, the health care provider may move one or both of your legs to different positions to check for pain. Various tests may be done to help verify the diagnosis, including:  Imaging tests to look for other causes of pain. These may include:  MRI.  CT scan.  Bone scan.  Diagnostic injection. A numbing medicine is injected into the SI joint  using a needle. If the pain is temporarily improved or stopped after the injection, this can indicate that SI joint dysfunction is the problem. How is this treated? Treatment may vary depending on the cause and severity of your condition. Treatment options may include:  Applying ice or heat to the lower back area. This can help to reduce pain and muscle spasms.  Medicines to relieve pain or inflammation or to relax the muscles.  Wearing a back brace (sacroiliac brace) to help support the joint while your back is healing.  Physical therapy to increase muscle strength around the joint and flexibility at the joint. This may also involve learning proper body positions and ways of moving to relieve stress on the joint.  Direct manipulation of the SI joint.  Injections of steroid medicine into the joint in order to reduce pain and swelling.  Radiofrequency ablation to burn away nerves that are carrying pain messages from the joint.  Use of a device that provides electrical stimulation in order to reduce pain at the joint.  Surgery to put in screws and plates that limit or prevent joint motion. This is rare. Follow these instructions at home:  Rest as needed. Limit your activities as directed by your health care provider.  Take medicines only as directed by your health care provider.  If directed, apply ice to the affected area:  Put ice in a plastic bag.  Place a towel between your skin and the bag.  Leave the ice on for 20  minutes, 2-3 times per day.  Use a heating pad or a moist heat pack as directed by your health care provider.  Exercise as directed by your health care provider or physical therapist.  Keep all follow-up visits as directed by your health care provider. This is important. Contact a health care provider if:  Your pain is not controlled with medicine.  You have a fever.  You have increasingly severe pain. Get help right away if:  You have weakness,  numbness, or tingling in your legs or feet.  You lose control of your bladder or bowel. This information is not intended to replace advice given to you by your health care provider. Make sure you discuss any questions you have with your health care provider. Document Released: 07/03/2008 Document Revised: 09/12/2015 Document Reviewed: 12/12/2013 Elsevier Interactive Patient Education  2017 [employer].     IF you received an x-ray today, you will receive an invoice from [facility]. Please contact [facility] at [phone] with questions or concerns regarding your invoice.   IF you received labwork today, you will receive an invoice from [facility]. Please contact [facility] at [phone] with questions or concerns regarding your invoice.   Our billing staff will not be able to assist you with questions regarding bills from these companies.  You will be contacted with the lab results as soon as they are available. The fastest way to get your results is to activate your My Chart account. Instructions are located on the last page of this paperwork. If you have not heard from [place] regarding the results in 2 weeks, please contact this office.    We recommend that you schedule a mammogram for breast cancer screening. Typically, you do not need a referral to do this. Please contact a local imaging center to schedule your mammogram.  [facility] - [phone]  *ask for the Radiology Department [facility] ([place]) - [phone] or [phone]  [facility] - [phone] [place] [phone] [facility] - [phone]  *ask for the [facility] - [phone]  *ask for the Radiology Department [facility] - [phone]  *ask for the [facility] - [phone]

## 2016-09-16 ENCOUNTER — Telehealth: Payer: Self-pay | Admitting: Physician Assistant

## 2016-09-16 NOTE — Telephone Encounter (Signed)
Still has dull pain in back into left side , leans onto right side with relief. Standing and sitting for a long time aggravates it. 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 meds make her tired so dont use at work.  Boss wants to make sure she can safely  come to work and sit or if she needs a restriction? Off?  Pt states she can work, but it does irritate

## 2016-09-16 NOTE — Telephone Encounter (Signed)
Please advise patient that she must take the mobic at night at least.  She should be icing the area during three times per day as well

## 2016-09-16 NOTE — Telephone Encounter (Signed)
PATIENT STATES SHE SAW STEPHANIE ENGLISH ON Friday. SHE SAID STEPHANIE TOLD HER TO CALL BACK WITH HER BLOOD PRESSURE. WHEN SHE TOOK IT ON TUES. (09/15/16) HER BLOOD PRESSURE WAS 160/98. SHE ALSO SAID SHE WAS SEEN FOR A SWOLLEN SI JOINT AND STEPHANIE TOLD HER TO RELAX FOR A WEEK. SHE IS AT WORK TODAY AND HER BOSS NEEDS TO KNOW IF SHE SHOULD BE AT WORK. PATIENT SAID STEPHANIE TOLD HER TO CALL AND SHE DOES NOT UNDERSTAND WHY I HAVE TO TAKE A MESSAGE. SHE WOULD LIKE TO SPEAK WITH SOMEONE AS SOON AS POSSIBLE. PLEASE LEAVE A MESSAGE BECAUSE SHE IS AT WORK. BEST PHONE [phone] (CELL) PHARMACY CHOICE IS [facility] [address] ([place]) [place]

## 2016-09-16 NOTE — Telephone Encounter (Signed)
PATIENT CALLED BACK TO SAY THAT SHE WENT TO HER PHARMACY TO PICK UP THE NEW BLOOD PRESSURE MEDICINE THAT STEPHANIE WAS GOING TO PRESCRIBE HER BUT IT WAS NOT THERE. WHILE SHE WAS AT THE PHARMACY SHE TOOK HER BLOOD PRESSURE AGAIN AND IT WAS 184/103. SHE ALSO WAS NOT CLEAR ABOUT WHAT SHE SHOULD DO ABOUT HER WORK? BEST PHONE [phone] (CELL) PHARMACY CHOICE IS [facility] [address]. [place]

## 2016-09-17 ENCOUNTER — Ambulatory Visit: Payer: Commercial Managed Care - PPO | Admitting: Physician Assistant

## 2016-09-17 NOTE — Telephone Encounter (Signed)
Pt is calling back checking on when the new blood pressure meds will be called in  [phone]

## 2016-09-18 MED ORDER — AMLODIPINE BESYLATE 5 MG PO TABS: 5.0000 mg | ORAL_TABLET | Freq: Every day | ORAL | 1 refills | Status: DC

## 2016-09-18 NOTE — Telephone Encounter (Signed)
I thought she was placed on schedule to follow up.  I am sending in amlodipine.  Please contact patient about this.  We will check this in 2-3 weeks.  However if she is having worsening back pain, she should return tomorrow.  Hopefully she is taking the medication and icing as we discussed.

## 2016-09-18 NOTE — Telephone Encounter (Signed)
Pt is using mobic Cant ice at work  Laying at home helps.  Has f/u tomorrow

## 2016-09-19 ENCOUNTER — Encounter: Payer: Self-pay | Admitting: Physician Assistant

## 2016-09-19 ENCOUNTER — Ambulatory Visit (INDEPENDENT_AMBULATORY_CARE_PROVIDER_SITE_OTHER): Payer: Commercial Managed Care - PPO | Admitting: Physician Assistant

## 2016-09-19 VITALS — BP 154/102 | HR 98 | Temp 98.4°F | Resp 16 | Ht 65.0 in | Wt 216.0 lb

## 2016-09-19 DIAGNOSIS — I10 Essential (primary) hypertension: Secondary | ICD-10-CM

## 2016-09-19 DIAGNOSIS — M461 Sacroiliitis, not elsewhere classified: Secondary | ICD-10-CM

## 2016-09-19 MED ORDER — PREDNISONE 20 MG PO TABS: ORAL_TABLET | ORAL | 0 refills | Status: DC

## 2016-09-19 NOTE — Patient Instructions (Addendum)
IF you received an x-ray today, you will receive an invoice from [facility]. Please contact [facility] at [phone] with questions or concerns regarding your invoice.   IF you received labwork today, you will receive an invoice from [facility]. Please contact [facility] at [phone] with questions or concerns regarding your invoice.   Our billing staff will not be able to assist you with questions regarding bills from these companies.  You will be contacted with the lab results as soon as they are available. The fastest way to get your results is to activate your My Chart account. Instructions are located on the last page of this paperwork. If you have not heard from [place] regarding the results in 2 weeks, please contact this office.     Sacroiliac Joint Dysfunction Sacroiliac joint dysfunction is a condition that causes inflammation on one or both sides of the sacroiliac (SI) joint. The SI joint connects the lower part of the spine (sacrum) with the two upper portions of the pelvis (ilium). This condition causes deep aching or burning pain in the low back. In some cases, the pain may also spread into one or both buttocks or hips or spread down the legs. What are the causes? This condition may be caused by:  Pregnancy. During pregnancy, extra stress is put on the SI joints because the pelvis widens.  Injury, such as: ? Car accidents. ? Sport-related injuries. ? Work-related injuries.  Having one leg that is shorter than the other.  Conditions that affect the joints, such as: ? Rheumatoid arthritis. ? Gout. ? Psoriatic arthritis. ? Joint infection (septic arthritis).  Sometimes, the cause of SI joint dysfunction is not known. What are the signs or symptoms? Symptoms of this condition include:  Aching or burning pain in the lower back. The pain may also spread to other areas, such as: ? Buttocks. ? Groin. ? Thighs and legs.  Muscle spasms in or around  the painful areas.  Increased pain when standing, walking, running, stair climbing, bending, or lifting.  How is this diagnosed? Your health care provider will do a physical exam and take your medical history. During the exam, the health care provider may move one or both of your legs to different positions to check for pain. Various tests may be done to help verify the diagnosis, including:  Imaging tests to look for other causes of pain. These may include: ? MRI. ? CT scan. ? Bone scan.  Diagnostic injection. A numbing medicine is injected into the SI joint using a needle. If the pain is temporarily improved or stopped after the injection, this can indicate that SI joint dysfunction is the problem.  How is this treated? Treatment may vary depending on the cause and severity of your condition. Treatment options may include:  Applying ice or heat to the lower back area. This can help to reduce pain and muscle spasms.  Medicines to relieve pain or inflammation or to relax the muscles.  Wearing a back brace (sacroiliac brace) to help support the joint while your back is healing.  Physical therapy to increase muscle strength around the joint and flexibility at the joint. This may also involve learning proper body positions and ways of moving to relieve stress on the joint.  Direct manipulation of the SI joint.  Injections of steroid medicine into the joint in order to reduce pain and swelling.  Radiofrequency ablation to burn away nerves that are carrying pain messages from the joint.  Use  of a device that provides electrical stimulation in order to reduce pain at the joint.  Surgery to put in screws and plates that limit or prevent joint motion. This is rare.  Follow these instructions at home:  Rest as needed. Limit your activities as directed by your health care provider.  Take medicines only as directed by your health care provider.  If directed, apply ice to the affected  area: ? Put ice in a plastic bag. ? Place a towel between your skin and the bag. ? Leave the ice on for 20 minutes, 2-3 times per day.  Use a heating pad or a moist heat pack as directed by your health care provider.  Exercise as directed by your health care provider or physical therapist.  Keep all follow-up visits as directed by your health care provider. This is important. Contact a health care provider if:  Your pain is not controlled with medicine.  You have a fever.  You have increasingly severe pain. Get help right away if:  You have weakness, numbness, or tingling in your legs or feet.  You lose control of your bladder or bowel. This information is not intended to replace advice given to you by your health care provider. Make sure you discuss any questions you have with your health care provider. Document Released: 07/03/2008 Document Revised: 09/12/2015 Document Reviewed: 12/12/2013 Elsevier Interactive Patient Education  2018 [place]. Marland Kitchenda DASH Eating Plan DASH stands for "Dietary Approaches to Stop Hypertension." The DASH eating plan is a healthy eating plan that has been shown to reduce high blood pressure (hypertension). It may also reduce your risk for type 2 diabetes, heart disease, and stroke. The DASH eating plan may also help with weight loss. What are tips for following this plan? General guidelines  Avoid eating more than 2,300 mg (milligrams) of salt (sodium) a day. If you have hypertension, you may need to reduce your sodium intake to 1,500 mg a day.  Limit alcohol intake to no more than 1 drink a day for nonpregnant women and 2 drinks a day for men. One drink equals 12 oz of beer, 5 oz of wine, or 1 oz of hard liquor.  Work with your health care provider to maintain a healthy body weight or to lose weight. Ask what an ideal weight is for you.  Get at least 30 minutes of exercise that causes your heart to beat faster (aerobic exercise) most  days of the week. Activities may include walking, swimming, or biking.  Work with your health care provider or diet and nutrition specialist (dietitian) to adjust your eating plan to your individual calorie needs. Reading food labels  Check food labels for the amount of sodium per serving. Choose foods with less than 5 percent of the Daily Value of sodium. Generally, foods with less than 300 mg of sodium per serving fit into this eating plan.  To find whole grains, look for the word "whole" as the first word in the ingredient list. Shopping  Buy products labeled as "low-sodium" or "no salt added."  Buy fresh foods. Avoid canned foods and premade or frozen meals. Cooking  Avoid adding salt when cooking. Use salt-free seasonings or herbs instead of table salt or sea salt. Check with your health care provider or pharmacist before using salt substitutes.  Do not fry foods. Cook foods using healthy methods such as baking, boiling, grilling, and broiling instead.  Cook with heart-healthy oils, such as olive, canola, soybean, or sunflower  oil. Meal planning   Eat a balanced diet that includes: ? 5 or more servings of fruits and vegetables each day. At each meal, try to fill half of your plate with fruits and vegetables. ? Up to 6-8 servings of whole grains each day. ? Less than 6 oz of lean meat, poultry, or fish each day. A 3-oz serving of meat is about the same size as a deck of cards. One egg equals 1 oz. ? 2 servings of low-fat dairy each day. ? A serving of nuts, seeds, or beans 5 times each week. ? Heart-healthy fats. Healthy fats called Omega-3 fatty acids are found in foods such as flaxseeds and coldwater fish, like sardines, salmon, and mackerel.  Limit how much you eat of the following: ? Canned or prepackaged foods. ? Food that is high in trans fat, such as fried foods. ? Food that is high in saturated fat, such as fatty meat. ? Sweets, desserts, sugary drinks, and other foods  with added sugar. ? Full-fat dairy products.  Do not salt foods before eating.  Try to eat at least 2 vegetarian meals each week.  Eat more home-cooked food and less restaurant, buffet, and fast food.  When eating at a restaurant, ask that your food be prepared with less salt or no salt, if possible. What foods are recommended? The items listed may not be a complete list. Talk with your dietitian about what dietary choices are best for you. Grains Whole-grain or whole-wheat bread. Whole-grain or whole-wheat pasta. Brown rice. [place]. Bulgur. Whole-grain and low-sodium cereals. Pita bread. Low-fat, low-sodium crackers. Whole-wheat flour tortillas. Vegetables Fresh or frozen vegetables (raw, steamed, roasted, or grilled). Low-sodium or reduced-sodium tomato and vegetable juice. Low-sodium or reduced-sodium tomato sauce and tomato paste. Low-sodium or reduced-sodium canned vegetables. Fruits All fresh, dried, or frozen fruit. Canned fruit in natural juice (without added sugar). Meat and other protein foods Skinless chicken or [place]. Ground chicken or [place]. Pork with fat trimmed off. Fish and seafood. Egg whites. Dried beans, peas, or lentils. Unsalted nuts, nut butters, and seeds. Unsalted canned beans. Lean cuts of beef with fat trimmed off. Low-sodium, lean deli meat. Dairy Low-fat (1%) or fat-free (skim) milk. Fat-free, low-fat, or reduced-fat cheeses. Nonfat, low-sodium ricotta or cottage cheese. Low-fat or nonfat yogurt. Low-fat, low-sodium cheese. Fats and oils Soft margarine without trans fats. Vegetable oil. Low-fat, reduced-fat, or light mayonnaise and salad dressings (reduced-sodium). Canola, safflower, olive, soybean, and sunflower oils. Avocado. Seasoning and other foods Herbs. Spices. Seasoning mixes without salt. Unsalted popcorn and pretzels. Fat-free sweets. What foods are not recommended? The items listed may not be a complete list. Talk with your dietitian about  what dietary choices are best for you. Grains Baked goods made with fat, such as croissants, muffins, or some breads. Dry pasta or rice meal packs. Vegetables Creamed or fried vegetables. Vegetables in a cheese sauce. Regular canned vegetables (not low-sodium or reduced-sodium). Regular canned tomato sauce and paste (not low-sodium or reduced-sodium). Regular tomato and vegetable juice (not low-sodium or reduced-sodium). Angie Fava. Olives. Fruits Canned fruit in a light or heavy syrup. Fried fruit. Fruit in cream or butter sauce. Meat and other protein foods Fatty cuts of meat. Ribs. Fried meat. Berniece Salines. Sausage. Bologna and other processed lunch meats. Salami. Fatback. Hotdogs. Bratwurst. Salted nuts and seeds. Canned beans with added salt. Canned or smoked fish. Whole eggs or egg yolks. Chicken or [place] with skin. Dairy Whole or 2% milk, cream, and half-and-half. Whole or full-fat cream  cheese. Whole-fat or sweetened yogurt. Full-fat cheese. Nondairy creamers. Whipped toppings. Processed cheese and cheese spreads. Fats and oils Butter. Stick margarine. Lard. Shortening. Ghee. Bacon fat. Tropical oils, such as coconut, palm kernel, or palm oil. Seasoning and other foods Salted popcorn and pretzels. Onion salt, garlic salt, seasoned salt, table salt, and sea salt. Worcestershire sauce. Tartar sauce. Barbecue sauce. Teriyaki sauce. Soy sauce, including reduced-sodium. Steak sauce. Canned and packaged gravies. Fish sauce. Oyster sauce. Cocktail sauce. Horseradish that you find on the shelf. Ketchup. Mustard. Meat flavorings and tenderizers. Bouillon cubes. Hot sauce and Tabasco sauce. Premade or packaged marinades. Premade or packaged taco seasonings. Relishes. Regular salad dressings. Where to find more information:  National Heart, Lung, and [place]: https://wilson-eaton.com/  American Heart Association: www.heart.org Summary  The DASH eating plan is a healthy eating plan that has been shown to  reduce high blood pressure (hypertension). It may also reduce your risk for type 2 diabetes, heart disease, and stroke.  With the DASH eating plan, you should limit salt (sodium) intake to 2,300 mg a day. If you have hypertension, you may need to reduce your sodium intake to 1,500 mg a day.  When on the DASH eating plan, aim to eat more fresh fruits and vegetables, whole grains, lean proteins, low-fat dairy, and heart-healthy fats.  Work with your health care provider or diet and nutrition specialist (dietitian) to adjust your eating plan to your individual calorie needs. This information is not intended to replace advice given to you by your health care provider. Make sure you discuss any questions you have with your health care provider. Document Released: 03/26/2011 Document Revised: 03/30/2016 Document Reviewed: 03/30/2016 Elsevier Interactive Patient Education  2017 [employer].

## 2016-09-19 NOTE — Progress Notes (Signed)
PRIMARY CARE AT [place], [place] [address] [phone]  Date:  09/19/2016   Name:  Jasmine Franklin   DOB:  1971-04-16   MRN:  [id number]  PCP:  Patient, No Pcp Per    History of Present Illness:  Jasmine Franklin is a 46 y.o. female patient who presents to PCP with  Chief Complaint  Patient presents with  . Follow-up    Back pain     Patient reports that her low back pain is still the same.  She attempts taking mobic however this makes her very drowsy.  So she is not taking it daily.   She is currently only taken 1 dose of the medication, amlodipine.  No side effects noted at this time.  She denies any cp, palpitations, sob, or leg swellings.      Patient Active Problem List   Diagnosis Date Noted  . Endometriosis of fallopian tube 09/01/2012  . ELEVATED BLOOD PRESSURE WITHOUT DIAGNOSIS OF HYPERTENSION 12/10/2009  . HERNIATED CERVICAL DISC 10/11/2009  . NECK PAIN, LEFT 10/11/2009  . CERVICAL RADICULOPATHY, LEFT 10/11/2009    Past Medical History:  Diagnosis Date  . Slipped cervical disc 2011   pt did physical therapy- no longer having neck or left arm pain    Past Surgical History:  Procedure Laterality Date  . CESAREAN SECTION    . LAPAROSCOPIC BILATERAL SALPINGECTOMY Bilateral 08/16/2012   Procedure: LAPAROSCOPIC BILATERAL SALPINGECTOMY;  Surgeon: Imagene Gurney A. Alycia Rossetti, MD;  Location: [facility];  Service: Gynecology;  Laterality: Bilateral;  . TUBAL LIGATION      Social History  Substance Use Topics  . Smoking status: Never Smoker  . Smokeless tobacco: Never Used  . Alcohol use No    Family History  Problem Relation Age of Onset  . Cancer Father        lymphoma'  . Diabetes Brother   . Hypertension Paternal Grandmother   . Hypertension Paternal Grandfather     No Known Allergies  Medication list has been reviewed and updated.  Current Outpatient Prescriptions on File Prior to Visit  Medication Sig Dispense Refill  . amLODipine (NORVASC) 5 MG  tablet Take 1 tablet (5 mg total) by mouth daily. 30 tablet 1  . cyclobenzaprine (FLEXERIL) 10 MG tablet Take 1 tablet (10 mg total) by mouth 3 (three) times daily as needed for muscle spasms. 30 tablet 0  . meloxicam (MOBIC) 15 MG tablet Take 1 tablet (15 mg total) by mouth daily. 30 tablet 0  . ibuprofen (ADVIL,MOTRIN) 800 MG tablet TAKE 1 TABLET (800 MG TOTAL) BY MOUTH 3 (THREE) TIMES DAILY. (Patient not taking: Reported on 09/11/2016) 30 tablet 2  . oxyCODONE-acetaminophen (PERCOCET) 5-325 MG per tablet Take 2 tablets by mouth every 6 (six) hours as needed for pain. (Patient not taking: Reported on 09/11/2016) 30 tablet 0  . traMADol (ULTRAM) 50 MG tablet Take 1 tablet (50 mg total) by mouth every 6 (six) hours as needed for pain. (Patient not taking: Reported on 09/11/2016) 15 tablet 0   No current facility-administered medications on file prior to visit.     ROS ROS otherwise unremarkable unless listed above.  Physical Examination: BP (!) 164/97   Pulse 98   Temp 98.4 F (36.9 C) (Oral)   Resp 16   Ht 5\' 5"  (1.651 m)   Wt 216 lb (98 kg)   LMP 09/01/2016   SpO2 99%   BMI 35.94 kg/m  Ideal Body Weight: Weight in (lb) to  have BMI = 25: 149.9  Physical Exam  Constitutional: She is oriented to person, place, and time. She appears well-developed and well-nourished. No distress.  HENT:  Head: Normocephalic and atraumatic.  Right Ear: External ear normal.  Left Ear: External ear normal.  Eyes: Conjunctivae and EOM are normal. Pupils are equal, round, and reactive to light.  Cardiovascular: Normal rate and regular rhythm.   Pulmonary/Chest: Effort normal. No respiratory distress.  Musculoskeletal:       Right shoulder: She exhibits bony tenderness and spasm. She exhibits normal range of motion, no swelling, normal pulse and normal strength.  Tender along the si joint.    Neurological: She is alert and oriented to person, place, and time.  Skin: She is not diaphoretic.  Psychiatric:  She has a normal mood and affect. Her behavior is normal.     Assessment and Plan: Jasmine Franklin is a 46 y.o. female who is here today for cc of low back pian follow up, and htn.   1 dose at this time.  We can recheck in 2-3 weeks Back pain, start prednisone taper.  Advised icing and stretches.   Essential hypertension - Plan: Basic metabolic panel  Sacroiliitis ([place]) - Plan: predniSONE (DELTASONE) 20 MG tablet  Ivar Drape, PA-C [facility] 6/5/20187:27 AM

## 2016-09-20 LAB — BASIC METABOLIC PANEL
BUN / CREAT RATIO: 25 — AB (ref 9–23)
BUN: 18 mg/dL (ref 6–24)
CO2: 22 mmol/L (ref 18–29)
Calcium: 9.4 mg/dL (ref 8.7–10.2)
Chloride: 102 mmol/L (ref 96–106)
Creatinine, Ser: 0.73 mg/dL (ref 0.57–1.00)
GFR calc Af Amer: 113 mL/min/{1.73_m2} (ref >59)
GFR, EST NON AFRICAN AMERICAN: 98 mL/min/{1.73_m2} (ref >59)
Glucose: 88 mg/dL (ref 65–99)
Potassium: 4.6 mmol/L (ref 3.5–5.2)
SODIUM: 140 mmol/L (ref 134–144)

## 2016-10-02 ENCOUNTER — Ambulatory Visit: Payer: Commercial Managed Care - PPO | Admitting: Physician Assistant

## 2017-05-06 ENCOUNTER — Encounter (HOSPITAL_COMMUNITY): Payer: Self-pay | Admitting: Emergency Medicine

## 2017-05-06 ENCOUNTER — Emergency Department (HOSPITAL_COMMUNITY): Payer: No Typology Code available for payment source

## 2017-05-06 ENCOUNTER — Emergency Department (HOSPITAL_COMMUNITY)
Admission: EM | Admit: 2017-05-06 | Discharge: 2017-05-06 | Disposition: A | Discharge status: Home / Self Care | Payer: No Typology Code available for payment source | Attending: Emergency Medicine | Admitting: Emergency Medicine

## 2017-05-06 ENCOUNTER — Other Ambulatory Visit: Payer: Self-pay

## 2017-05-06 DIAGNOSIS — Z79899 Other long term (current) drug therapy: Secondary | ICD-10-CM | POA: Diagnosis not present

## 2017-05-06 DIAGNOSIS — R200 Anesthesia of skin: Secondary | ICD-10-CM | POA: Insufficient documentation

## 2017-05-06 DIAGNOSIS — R002 Palpitations: Secondary | ICD-10-CM | POA: Insufficient documentation

## 2017-05-06 DIAGNOSIS — M79602 Pain in left arm: Secondary | ICD-10-CM | POA: Diagnosis present

## 2017-05-06 LAB — BASIC METABOLIC PANEL
Anion gap: 12 (ref 5–15)
BUN: 18 mg/dL (ref 6–20)
CALCIUM: 9.2 mg/dL (ref 8.9–10.3)
CO2: 22 mmol/L (ref 22–32)
CREATININE: 0.88 mg/dL (ref 0.44–1.00)
Chloride: 103 mmol/L (ref 101–111)
GFR calc non Af Amer: >60 mL/min (ref >60)
Glucose, Bld: 88 mg/dL (ref 65–99)
Potassium: 3.2 mmol/L — ABNORMAL LOW (ref 3.5–5.1)
Sodium: 137 mmol/L (ref 135–145)

## 2017-05-06 LAB — I-STAT BETA HCG BLOOD, ED (MC, WL, AP ONLY): I-stat hCG, quantitative: <5 m[IU]/mL (ref <5)

## 2017-05-06 LAB — CBC
HCT: 41.7 % (ref 36.0–46.0)
Hemoglobin: 14.1 g/dL (ref 12.0–15.0)
MCH: 29.9 pg (ref 26.0–34.0)
MCHC: 33.8 g/dL (ref 30.0–36.0)
MCV: 88.3 fL (ref 78.0–100.0)
PLATELETS: 326 10*3/uL (ref 150–400)
RBC: 4.72 MIL/uL (ref 3.87–5.11)
RDW: 12.5 % (ref 11.5–15.5)
WBC: 8.2 10*3/uL (ref 4.0–10.5)

## 2017-05-06 LAB — I-STAT TROPONIN, ED: TROPONIN I, POC: 0.02 ng/mL (ref 0.00–0.08)

## 2017-05-06 NOTE — Discharge Instructions (Signed)
Today's workup without any acute findings.If symptoms persist.  Return for any new or worse symptoms.Follow-up with your doctor

## 2017-05-06 NOTE — ED Notes (Signed)
Patient given ginger ale per EDP

## 2017-05-06 NOTE — ED Triage Notes (Signed)
Patient with left arm pain that woke patient from sleep.  She states that she has been having pain in that arm but she woke up with shooting pain in her arm and her left leg was having numbness.

## 2017-05-06 NOTE — ED Provider Notes (Signed)
[facility] EMERGENCY DEPARTMENT Provider Note   CSN: [id number] Arrival date & time: 05/06/17  [id number]     History   Chief Complaint Chief Complaint  Patient presents with  . Arm Pain  . leg numbness    HPI Jasmine Franklin is a 47 y.o. female.  Patient symptoms have now resolved.  Was brought in by family member.  Patient awoke this morning at about 5 in the morning suddenly with heart palpitations and severe pain in the left arm.  And some left leg numbness.  No chest pain no shortness of breath no headache.  Patient felt fine when she went to bed.  Patient has had a history of a herniated disc on the right side of her neck in the past.  No symptoms from that now.  Patient symptoms currently have all resolved.  Patient been on the cardiac monitor here without any evidence of arrhythmias.  Patient still has a dull ache in the left upper arm area.      Past Medical History:  Diagnosis Date  . Slipped cervical disc 2011   pt did physical therapy- no longer having neck or left arm pain    Patient Active Problem List   Diagnosis Date Noted  . Endometriosis of fallopian tube 09/01/2012  . ELEVATED BLOOD PRESSURE WITHOUT DIAGNOSIS OF HYPERTENSION 12/10/2009  . HERNIATED CERVICAL DISC 10/11/2009  . NECK PAIN, LEFT 10/11/2009  . CERVICAL RADICULOPATHY, LEFT 10/11/2009    Past Surgical History:  Procedure Laterality Date  . CESAREAN SECTION    . LAPAROSCOPIC BILATERAL SALPINGECTOMY Bilateral 08/16/2012   Procedure: LAPAROSCOPIC BILATERAL SALPINGECTOMY;  Surgeon: Imagene Gurney A. Alycia Rossetti, MD;  Location: [facility];  Service: Gynecology;  Laterality: Bilateral;  . TUBAL LIGATION      OB History    Gravida Para Term Preterm AB Living   5 5       5    SAB TAB Ectopic Multiple Live Births                   Home Medications    Prior to Admission medications   Medication Sig Start Date End Date Taking? Authorizing Provider  cyclobenzaprine (FLEXERIL) 10 MG tablet Take  1 tablet (10 mg total) by mouth 3 (three) times daily as needed for muscle spasms. 09/11/16  Yes English, Colletta Maryland D, PA  meloxicam (MOBIC) 15 MG tablet Take 1 tablet (15 mg total) by mouth daily. 09/11/16  Yes English, Colletta Maryland D, PA  amLODipine (NORVASC) 5 MG tablet Take 1 tablet (5 mg total) by mouth daily. Patient not taking: Reported on 05/06/2017 09/18/16   Ivar Drape D, PA  ibuprofen (ADVIL,MOTRIN) 800 MG tablet TAKE 1 TABLET (800 MG TOTAL) BY MOUTH 3 (THREE) TIMES DAILY. Patient not taking: Reported on 09/11/2016 08/08/12   Terrance Mass, MD  oxyCODONE-acetaminophen (PERCOCET) 5-325 MG per tablet Take 2 tablets by mouth every 6 (six) hours as needed for pain. Patient not taking: Reported on 09/11/2016 08/16/12   Lahoma Crocker, MD  predniSONE (DELTASONE) 20 MG tablet Take 3 PO QAM x3days, 2 PO QAM x3days, 1 PO QAM x3days Patient not taking: Reported on 05/06/2017 09/19/16   Ivar Drape D, PA  traMADol (ULTRAM) 50 MG tablet Take 1 tablet (50 mg total) by mouth every 6 (six) hours as needed for pain. Patient not taking: Reported on 09/11/2016 08/23/12   Dorothyann Gibbs, NP    Family History Family History  Problem Relation Age of Onset  . Cancer  Father        lymphoma'  . Diabetes Brother   . Hypertension Paternal Grandmother   . Hypertension Paternal Grandfather     Social History Social History   Tobacco Use  . Smoking status: Never Smoker  . Smokeless tobacco: Never Used  Substance Use Topics  . Alcohol use: No  . Drug use: No     Allergies   Amoxicillin   Review of Systems Review of Systems  Constitutional: Negative for fever.  HENT: Negative for congestion.   Eyes: Negative for visual disturbance.  Respiratory: Negative for shortness of breath.   Cardiovascular: Positive for palpitations. Negative for chest pain.  Gastrointestinal: Negative for abdominal pain.  Genitourinary: Negative for dysuria.  Musculoskeletal: Negative for back pain and  joint swelling.  Skin: Negative for rash and wound.  Neurological: Negative for weakness and numbness.  Hematological: Does not bruise/bleed easily.  Psychiatric/Behavioral: Negative for confusion.     Physical Exam Updated Vital Signs BP (!) 161/91   Resp 16   LMP 04/05/2017 (Approximate)   Physical Exam  Constitutional: She is oriented to person, place, and time. She appears well-developed and well-nourished. No distress.  HENT:  Head: Normocephalic and atraumatic.  Mouth/Throat: Oropharynx is clear and moist.  Eyes: Conjunctivae and EOM are normal. Pupils are equal, round, and reactive to light.  Neck: Normal range of motion. Neck supple.  Cardiovascular: Normal rate, regular rhythm and normal heart sounds.  Pulmonary/Chest: Effort normal and breath sounds normal.  Abdominal: Soft. Bowel sounds are normal.  Musculoskeletal: Normal range of motion. She exhibits no edema, tenderness or deformity.  Patient's radial pulses are 2+ excellent cap refill to the fingers.  Good range of motion of the left hand wrist elbow and shoulder.  No numbness to any extremity currently.  Neurological: She is alert and oriented to person, place, and time. No cranial nerve deficit or sensory deficit. She exhibits normal muscle tone. Coordination normal.  Skin: Skin is warm. No rash noted.  Nursing note and vitals reviewed.    ED Treatments / Results  Labs (all labs ordered are listed, but only abnormal results are displayed) Labs Reviewed  BASIC METABOLIC PANEL - Abnormal; Notable for the following components:      Result Value   Potassium 3.2 (*)    All other components within normal limits  CBC  I-STAT TROPONIN, ED  I-STAT BETA HCG BLOOD, ED ([facility], [facility], [facility] ONLY)    EKG  EKG Interpretation  Date/Time:  Thursday May 06 2017 06:20:47 EST Ventricular Rate:  102 PR Interval:  130 QRS Duration: 80 QT Interval:  348 QTC Calculation: 453 R Axis:   56 Text Interpretation:  Sinus  tachycardia Nonspecific ST and T wave abnormality Abnormal ECG Confirmed by Fredia Sorrow [phone]) on 05/06/2017 11:00:00 AM       Radiology Dg Chest 2 View  Result Date: 05/06/2017 CLINICAL DATA:  Sharp pain in the left arm since wakening. EXAM: CHEST  2 VIEW COMPARISON:  07/06/2012 FINDINGS: Heart size is normal. Mediastinal shadows are normal. The lungs are clear. No bronchial thickening. No infiltrate, mass, effusion or collapse. Pulmonary vascularity is normal. No bony abnormality. IMPRESSION: Normal chest Electronically Signed   By: Nelson Chimes M.D.   On: 05/06/2017 07:00    Procedures Procedures (including critical care time)  Medications Ordered in ED Medications - No data to display   Initial Impression / Assessment and Plan / ED Course  I have reviewed the triage vital signs and  the nursing notes.  Pertinent labs & imaging results that were available during my care of the patient were reviewed by me and considered in my medical decision making (see chart for details).     Cardiac monitoring shows no evidence of arrhythmia.  Basic labs without any significant abnormalities.  Patient's symptoms have all resolved other than a dull ache in the left upper arm area.  Patient may very well have a mild radiculopathy ongoing.  But nothing severe significant at this time.  Patient stable for discharge home.  Patient has primary care follow-up available.  Final Clinical Impressions(s) / ED Diagnoses   Final diagnoses:  Left arm pain    ED Discharge Orders    None       Fredia Sorrow, MD 05/06/17 1353

## 2017-05-24 ENCOUNTER — Telehealth: Payer: Self-pay

## 2017-05-24 ENCOUNTER — Other Ambulatory Visit: Payer: Self-pay

## 2017-05-24 ENCOUNTER — Ambulatory Visit (INDEPENDENT_AMBULATORY_CARE_PROVIDER_SITE_OTHER): Payer: No Typology Code available for payment source | Admitting: Physician Assistant

## 2017-05-24 VITALS — BP 152/100 | HR 93 | Temp 98.5°F | Resp 16 | Ht 65.0 in | Wt 213.0 lb

## 2017-05-24 DIAGNOSIS — M62838 Other muscle spasm: Secondary | ICD-10-CM

## 2017-05-24 MED ORDER — MELOXICAM 15 MG PO TABS: 15.0000 mg | ORAL_TABLET | Freq: Every day | ORAL | 0 refills | Status: DC

## 2017-05-24 MED ORDER — CYCLOBENZAPRINE HCL 10 MG PO TABS: 5.0000 mg | ORAL_TABLET | Freq: Three times a day (TID) | ORAL | 0 refills | Status: DC | PRN

## 2017-05-24 NOTE — Patient Instructions (Addendum)
Please ice the shoulder area three times per day 15 minutes. Do not take naproxen or ibuprofen with the mobic.  You are able to take tylenol.   Please do not operate heavy machinery with taking the flexeril  Cervical Strain and Sprain Rehab Ask your health care provider which exercises are safe for you. Do exercises exactly as told by your health care provider and adjust them as directed. It is normal to feel mild stretching, pulling, tightness, or discomfort as you do these exercises, but you should stop right away if you feel sudden pain or your pain gets worse.Do not begin these exercises until told by your health care provider. Stretching and range of motion exercises These exercises warm up your muscles and joints and improve the movement and flexibility of your neck. These exercises also help to relieve pain, numbness, and tingling. Exercise A: Cervical side bend  1. Using good posture, sit on a stable chair or stand up. 2. Without moving your shoulders, slowly tilt your left / right ear to your shoulder until you feel a stretch in your neck muscles. You should be looking straight ahead. 3. Hold for __________ seconds. 4. Repeat with the other side of your neck. Repeat __________ times. Complete this exercise __________ times a day. Exercise B: Cervical rotation  1. Using good posture, sit on a stable chair or stand up. 2. Slowly turn your head to the side as if you are looking over your left / right shoulder. ? Keep your eyes level with the ground. ? Stop when you feel a stretch along the side and the back of your neck. 3. Hold for __________ seconds. 4. Repeat this by turning to your other side. Repeat __________ times. Complete this exercise __________ times a day. Exercise C: Thoracic extension and pectoral stretch 1. Roll a towel or a small blanket so it is about 4 inches (10 cm) in diameter. 2. Lie down on your back on a firm surface. 3. Put the towel lengthwise, under your  spine in the middle of your back. It should not be not under your shoulder blades. The towel should line up with your spine from your middle back to your lower back. 4. Put your hands behind your head and let your elbows fall out to your sides. 5. Hold for __________ seconds. Repeat __________ times. Complete this exercise __________ times a day. Strengthening exercises These exercises build strength and endurance in your neck. Endurance is the ability to use your muscles for a long time, even after your muscles get tired. Exercise D: Upper cervical flexion, isometric 1. Lie on your back with a thin pillow behind your head and a small rolled-up towel under your neck. 2. Gently tuck your chin toward your chest and nod your head down to look toward your feet. Do not lift your head off the pillow. 3. Hold for __________ seconds. 4. Release the tension slowly. Relax your neck muscles completely before you repeat this exercise. Repeat __________ times. Complete this exercise __________ times a day. Exercise E: Cervical extension, isometric  1. Stand about 6 inches (15 cm) away from a wall, with your back facing the wall. 2. Place a soft object, about 6-8 inches (15-20 cm) in diameter, between the back of your head and the wall. A soft object could be a small pillow, a ball, or a folded towel. 3. Gently tilt your head back and press into the soft object. Keep your jaw and forehead relaxed. 4. Hold for __________  seconds. 5. Release the tension slowly. Relax your neck muscles completely before you repeat this exercise. Repeat __________ times. Complete this exercise __________ times a day. Posture and body mechanics  Body mechanics refers to the movements and positions of your body while you do your daily activities. Posture is part of body mechanics. Good posture and healthy body mechanics can help to relieve stress in your body's tissues and joints. Good posture means that your spine is in its  natural S-curve position (your spine is neutral), your shoulders are pulled back slightly, and your head is not tipped forward. The following are general guidelines for applying improved posture and body mechanics to your everyday activities. Standing  When standing, keep your spine neutral and keep your feet about hip-width apart. Keep a slight bend in your knees. Your ears, shoulders, and hips should line up.  When you do a task in which you stand in one place for a long time, place one foot up on a stable object that is 2-4 inches (5-10 cm) high, such as a footstool. This helps keep your spine neutral. Sitting   When sitting, keep your spine neutral and your keep feet flat on the floor. Use a footrest, if necessary, and keep your thighs parallel to the floor. Avoid rounding your shoulders, and avoid tilting your head forward.  When working at a desk or a computer, keep your desk at a height where your hands are slightly lower than your elbows. Slide your chair under your desk so you are close enough to maintain good posture.  When working at a computer, place your monitor at a height where you are looking straight ahead and you do not have to tilt your head forward or downward to look at the screen. Resting When lying down and resting, avoid positions that are most painful for you. Try to support your neck in a neutral position. You can use a contour pillow or a small rolled-up towel. Your pillow should support your neck but not push on it. This information is not intended to replace advice given to you by your health care provider. Make sure you discuss any questions you have with your health care provider. Document Released: 04/06/2005 Document Revised: 12/12/2015 Document Reviewed: 03/13/2015 Elsevier Interactive Patient Education  2018 [employer].    IF you received an x-ray today, you will receive an invoice from [facility]. Please contact [facility] at  [phone] with questions or concerns regarding your invoice.   IF you received labwork today, you will receive an invoice from [facility]. Please contact [facility] at [phone] with questions or concerns regarding your invoice.   Our billing staff will not be able to assist you with questions regarding bills from these companies.  You will be contacted with the lab results as soon as they are available. The fastest way to get your results is to activate your My Chart account. Instructions are located on the last page of this paperwork. If you have not heard from [place] regarding the results in 2 weeks, please contact this office.

## 2017-05-24 NOTE — Telephone Encounter (Signed)
Phone call to [facility] on [address]. Pharmacy states they are filling Mobic 15mg  for patient. Closing encounter.

## 2017-05-24 NOTE — Telephone Encounter (Signed)
Copied from [place] [id number]. Topic: Quick Communication - See Telephone Encounter >> May 24, 2017 12:05 PM Oneta Rack wrote: CRM for notification. See Telephone encounter for:   05/24/17.    Relation to pt: self Call back number: [phone] [id number] [place], [place] [phone] (Phone) [phone] (Fax)    Reason for call:   Patient states pharmacy denies receiving meloxicam (MOBIC) 15 MG tablet, requesting Rx re send, please advise  >> May 24, 2017 12:15 PM Oneta Rack wrote: CRM for notification. See Telephone encounter for:   05/24/17.    Relation to pt: self Call back number: [phone] Pharmacy: [id number] - [place], [place] [phone] (Phone) [phone] (Fax)    Reason for call:   Patient states pharmacy denies receiving meloxicam (MOBIC) 15 MG tablet, requesting Rx re send, please advise

## 2017-05-24 NOTE — Progress Notes (Signed)
PRIMARY CARE AT [place], [place] [address] [phone]  Date:  05/24/2017   Name:  Jasmine Franklin   DOB:  [birth year]   MRN:  [id number]  PCP:  Patient, No Pcp Per    History of Present Illness:  Jasmine Franklin is a 47 y.o. female patient who presents to PCP with  Chief Complaint  Patient presents with  . Arm Pain    pt states before christmas she had arm pain and whem to er. pain still continued moving down neck. x 2 month  . Neck Pain     She had some soreness of her left upper arm for 1 month.   She woke up with her left leg numbness and left arm numbness 2 weeks ago.  The pain had eased up in the ED.  She notes a knot on her shoulder.  No redness or warmth.   She has neck pain that radiates at her left shoulder radiating to her check and at the left side of her back.  She rates 5/10.  Aggravated with lifting anything such as laundry basket or cooking.  Right hand dominant.    no night sweats, fatigue.   She works in Therap[employer], art.  No hx of trauma or heavy lifting.     Patient Active Problem List   Diagnosis Date Noted  . Endometriosis of fallopian tube 09/01/2012  . ELEVATED BLOOD PRESSURE WITHOUT DIAGNOSIS OF HYPERTENSION 12/10/2009  . HERNIATED CERVICAL DISC 10/11/2009  . NECK PAIN, LEFT 10/11/2009  . CERVICAL RADICULOPATHY, LEFT 10/11/2009    Past Medical History:  Diagnosis Date  . Slipped cervical disc 2011   pt did physical therapy- no longer having neck or left arm pain    Past Surgical History:  Procedure Laterality Date  . CESAREAN SECTION    . LAPAROSCOPIC BILATERAL SALPINGECTOMY Bilateral 08/16/2012   Procedure: LAPAROSCOPIC BILATERAL SALPINGECTOMY;  Surgeon: Imagene Gurney A. Alycia Rossetti, MD;  Location: [facility];  Service: Gynecology;  Laterality: Bilateral;  . TUBAL LIGATION      Social History   Tobacco Use  . Smoking status: Never Smoker  . Smokeless tobacco: Never Used  Substance Use Topics  . Alcohol use: No  . Drug use: No     Family History  Problem Relation Age of Onset  . Cancer Father        lymphoma'  . Diabetes Brother   . Hypertension Paternal Grandmother   . Hypertension Paternal Grandfather     Allergies  Allergen Reactions  . Amoxicillin Itching    Medication list has been reviewed and updated.  No current outpatient medications on file prior to visit.   No current facility-administered medications on file prior to visit.     ROS ROS otherwise unremarkable unless listed above.  Physical Examination: BP (!) 152/100   Pulse 93   Temp 98.5 F (36.9 C) (Oral)   Resp 16   Ht 5\' 5"  (1.651 m)   Wt 213 lb (96.6 kg)   LMP 05/06/2017   SpO2 99%   BMI 35.45 kg/m  Ideal Body Weight: Weight in (lb) to have BMI = 25: 149.9  Physical Exam  Constitutional: She is oriented to person, place, and time. She appears well-developed and well-nourished. No distress.  HENT:  Head: Normocephalic and atraumatic.  Right Ear: External ear normal.  Left Ear: External ear normal.  Eyes: Conjunctivae and EOM are normal. Pupils are equal, round, and reactive to light.  Cardiovascular: Normal rate.  Pulmonary/Chest:  Effort normal. No respiratory distress.  Musculoskeletal:       Cervical back: She exhibits tenderness (adjacent musculature tenderness). She exhibits no bony tenderness, no swelling and no spasm.  Neurological: She is alert and oriented to person, place, and time.  Skin: She is not diaphoretic.  Psychiatric: She has a normal mood and affect. Her behavior is normal.     Assessment and Plan: Jasmine Franklin is a 47 y.o. female who is here today for cc of  Chief Complaint  Patient presents with  . Arm Pain    pt states before christmas she had arm pain and whem to er. pain still continued moving down neck. x 2 month  . Neck Pain  ice regimen, anti-inflammatory and msk relaxant. Follow up 2 weeks if no improvement. Trapezius muscle spasm - Plan: meloxicam (MOBIC) 15 MG tablet,  cyclobenzaprine (FLEXERIL) 10 MG tablet  Ivar Drape, PA-C [facility] 2/17/20198:34 PM

## 2017-06-07 ENCOUNTER — Ambulatory Visit (INDEPENDENT_AMBULATORY_CARE_PROVIDER_SITE_OTHER): Payer: No Typology Code available for payment source | Admitting: Physician Assistant

## 2017-06-07 VITALS — BP 130/89 | HR 94 | Temp 98.5°F | Resp 16 | Ht 65.0 in | Wt 217.0 lb

## 2017-06-07 DIAGNOSIS — M62838 Other muscle spasm: Secondary | ICD-10-CM

## 2017-06-07 NOTE — Patient Instructions (Signed)
Continue the low salt diet.   Make sure you are exercising 30 minutes 4 times per week. Continue the shoulder stretches and icing.  Let me know if this reoccurs.

## 2017-06-07 NOTE — Progress Notes (Signed)
PRIMARY CARE AT [place], [place] [id number] [phone]  Date:  06/07/2017   Name:  DANIJAH NOH   DOB:  Jan 11, 1971   MRN:  [id number]  PCP:  Patient, No Pcp Per    History of Present Illness:  Azaya L Raske is a 47 y.o. female patient who presents to PCP with  Chief Complaint  Patient presents with  . Spasms    follow up, pt states she is feeling better     Patient is here today for follow up. She was seen 8 days ago for trapezius muscle spasm.  Advised stretches, ice, steroid use, and msk relaxant.  She reports pain has improved.  Received massages, icing, and has performed stretches.  She states that the symptoms have mostly resolved.  No paresthesia.  rom back.   Patient Active Problem List   Diagnosis Date Noted  . Endometriosis of fallopian tube 09/01/2012  . ELEVATED BLOOD PRESSURE WITHOUT DIAGNOSIS OF HYPERTENSION 12/10/2009  . HERNIATED CERVICAL DISC 10/11/2009  . NECK PAIN, LEFT 10/11/2009  . CERVICAL RADICULOPATHY, LEFT 10/11/2009    Past Medical History:  Diagnosis Date  . Slipped cervical disc 2011   pt did physical therapy- no longer having neck or left arm pain    Past Surgical History:  Procedure Laterality Date  . CESAREAN SECTION    . LAPAROSCOPIC BILATERAL SALPINGECTOMY Bilateral 08/16/2012   Procedure: LAPAROSCOPIC BILATERAL SALPINGECTOMY;  Surgeon: Imagene Gurney A. Alycia Rossetti, MD;  Location: [facility];  Service: Gynecology;  Laterality: Bilateral;  . TUBAL LIGATION      Social History   Tobacco Use  . Smoking status: Never Smoker  . Smokeless tobacco: Never Used  Substance Use Topics  . Alcohol use: No  . Drug use: No    Family History  Problem Relation Age of Onset  . Cancer Father        lymphoma'  . Diabetes Brother   . Hypertension Paternal Grandmother   . Hypertension Paternal Grandfather     Allergies  Allergen Reactions  . Amoxicillin Itching    Medication list has been reviewed and updated.  Current Outpatient  Medications on File Prior to Visit  Medication Sig Dispense Refill  . cyclobenzaprine (FLEXERIL) 10 MG tablet Take 0.5-1 tablets (5-10 mg total) by mouth 3 (three) times daily as needed. 30 tablet 0  . meloxicam (MOBIC) 15 MG tablet Take 1 tablet (15 mg total) by mouth daily. 30 tablet 0   No current facility-administered medications on file prior to visit.     ROS ROS otherwise unremarkable unless listed above.  Physical Examination: BP (!) 162/88   Pulse 94   Temp 98.5 F (36.9 C) (Oral)   Resp 16   Ht 5\' 5"  (1.651 m)   Wt 217 lb (98.4 kg)   LMP  (LMP Unknown)   SpO2 98%   BMI 36.11 kg/m  Ideal Body Weight: Weight in (lb) to have BMI = 25: 149.9  Physical Exam  Constitutional: She is oriented to person, place, and time. She appears well-developed and well-nourished. No distress.  HENT:  Head: Normocephalic and atraumatic.  Right Ear: External ear normal.  Left Ear: External ear normal.  Eyes: Conjunctivae and EOM are normal. Pupils are equal, round, and reactive to light.  Cardiovascular: Normal rate.  Pulmonary/Chest: Effort normal. No respiratory distress.  Musculoskeletal:       Cervical back: She exhibits normal range of motion, no tenderness and no bony tenderness.  Neurological: She is alert  and oriented to person, place, and time.  Skin: She is not diaphoretic.  Psychiatric: She has a normal mood and affect. Her behavior is normal.     Assessment and Plan: Mikah L Terry is a 47 y.o. female who is here today for cc of  Chief Complaint  Patient presents with  . Spasms    follow up, pt states she is feeling better  resolved.  Rtc as needed.  Trapezius muscle spasm  Ivar Drape, PA-C [facility] 2/26/20193:55 PM

## 2017-06-15 ENCOUNTER — Encounter: Payer: Self-pay | Admitting: Physician Assistant

## 2017-06-22 ENCOUNTER — Other Ambulatory Visit: Payer: Self-pay | Admitting: Physician Assistant

## 2017-06-22 DIAGNOSIS — M62838 Other muscle spasm: Secondary | ICD-10-CM

## 2017-06-22 NOTE — Telephone Encounter (Signed)
meloxicam refill Last OV: 06/07/17 Last Refill: 05/24/17 Pharmacy:[facility] [address] [place], 

## 2017-07-28 ENCOUNTER — Encounter: Payer: Self-pay | Admitting: Physician Assistant

## 2018-11-25 IMAGING — DX DG SI JOINTS 3+V
2 series · 2 of 2 positions shown · non-contrast
Comparison: No prior.

CLINICAL DATA: SI joint tenderness.  Pain.  No known injury .

EXAM:
BILATERAL SACROILIAC JOINTS - 3+ VIEW

[si joint (1 of 2)]
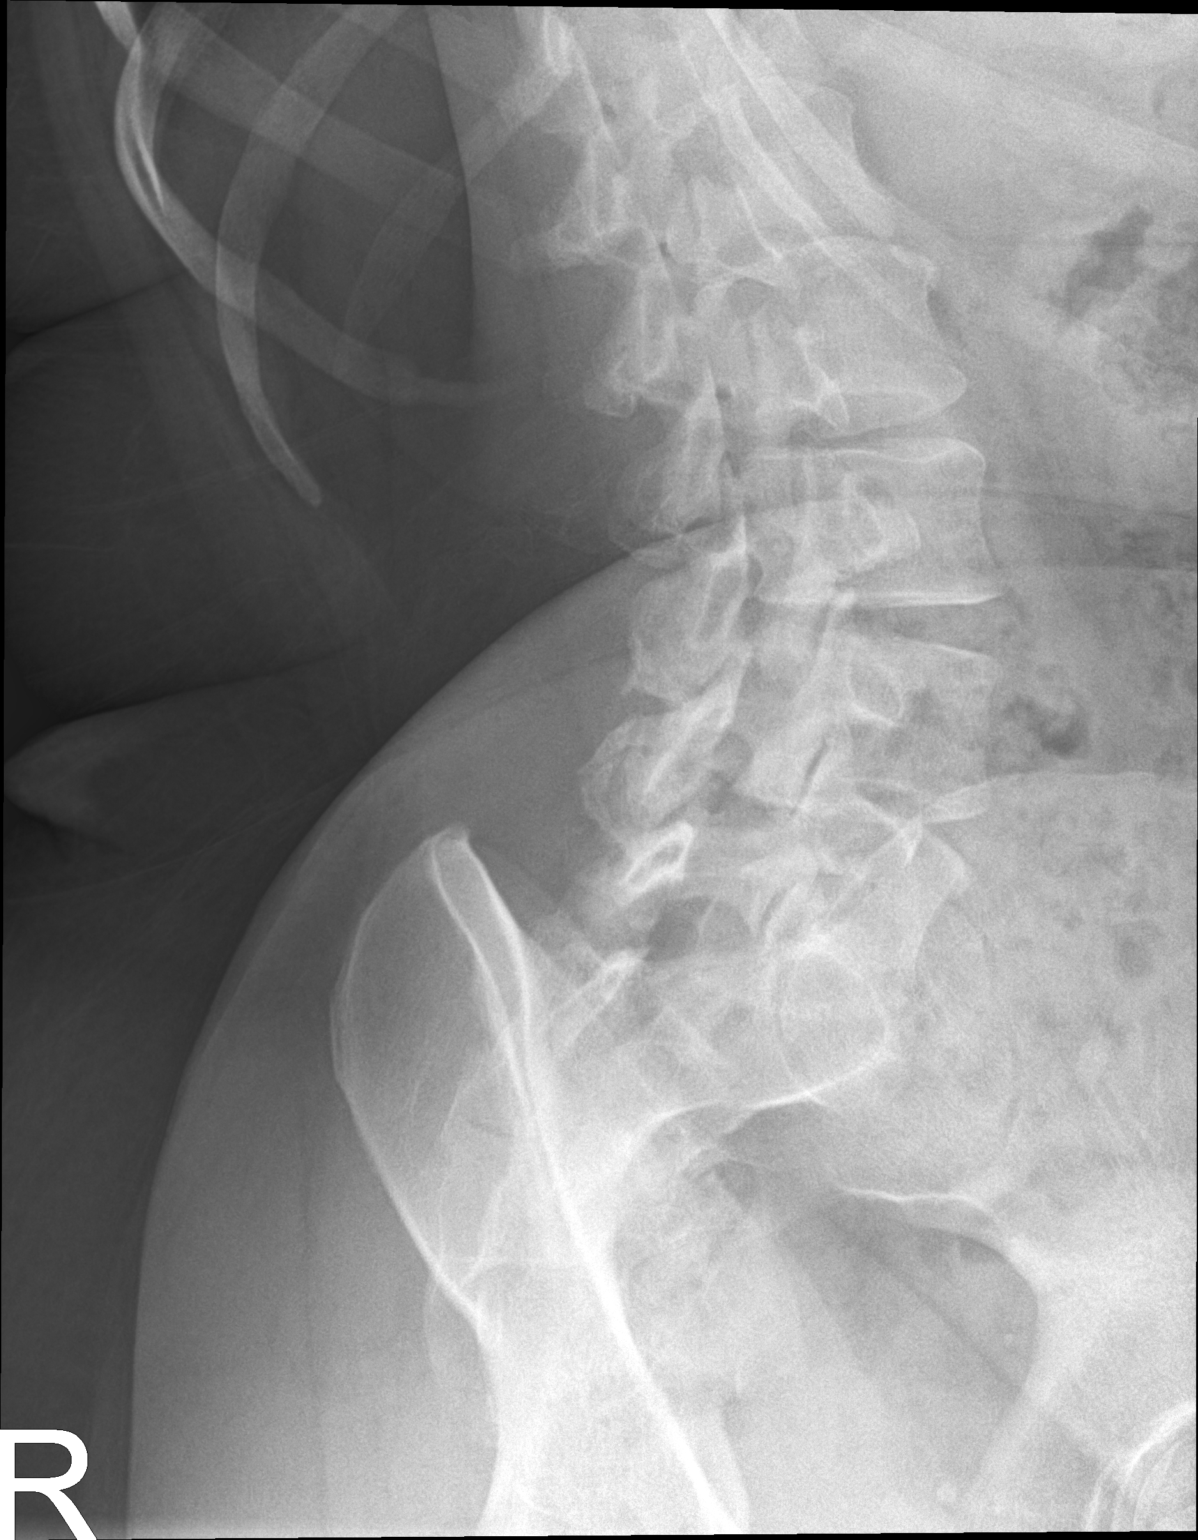

[si joint (2 of 2)]
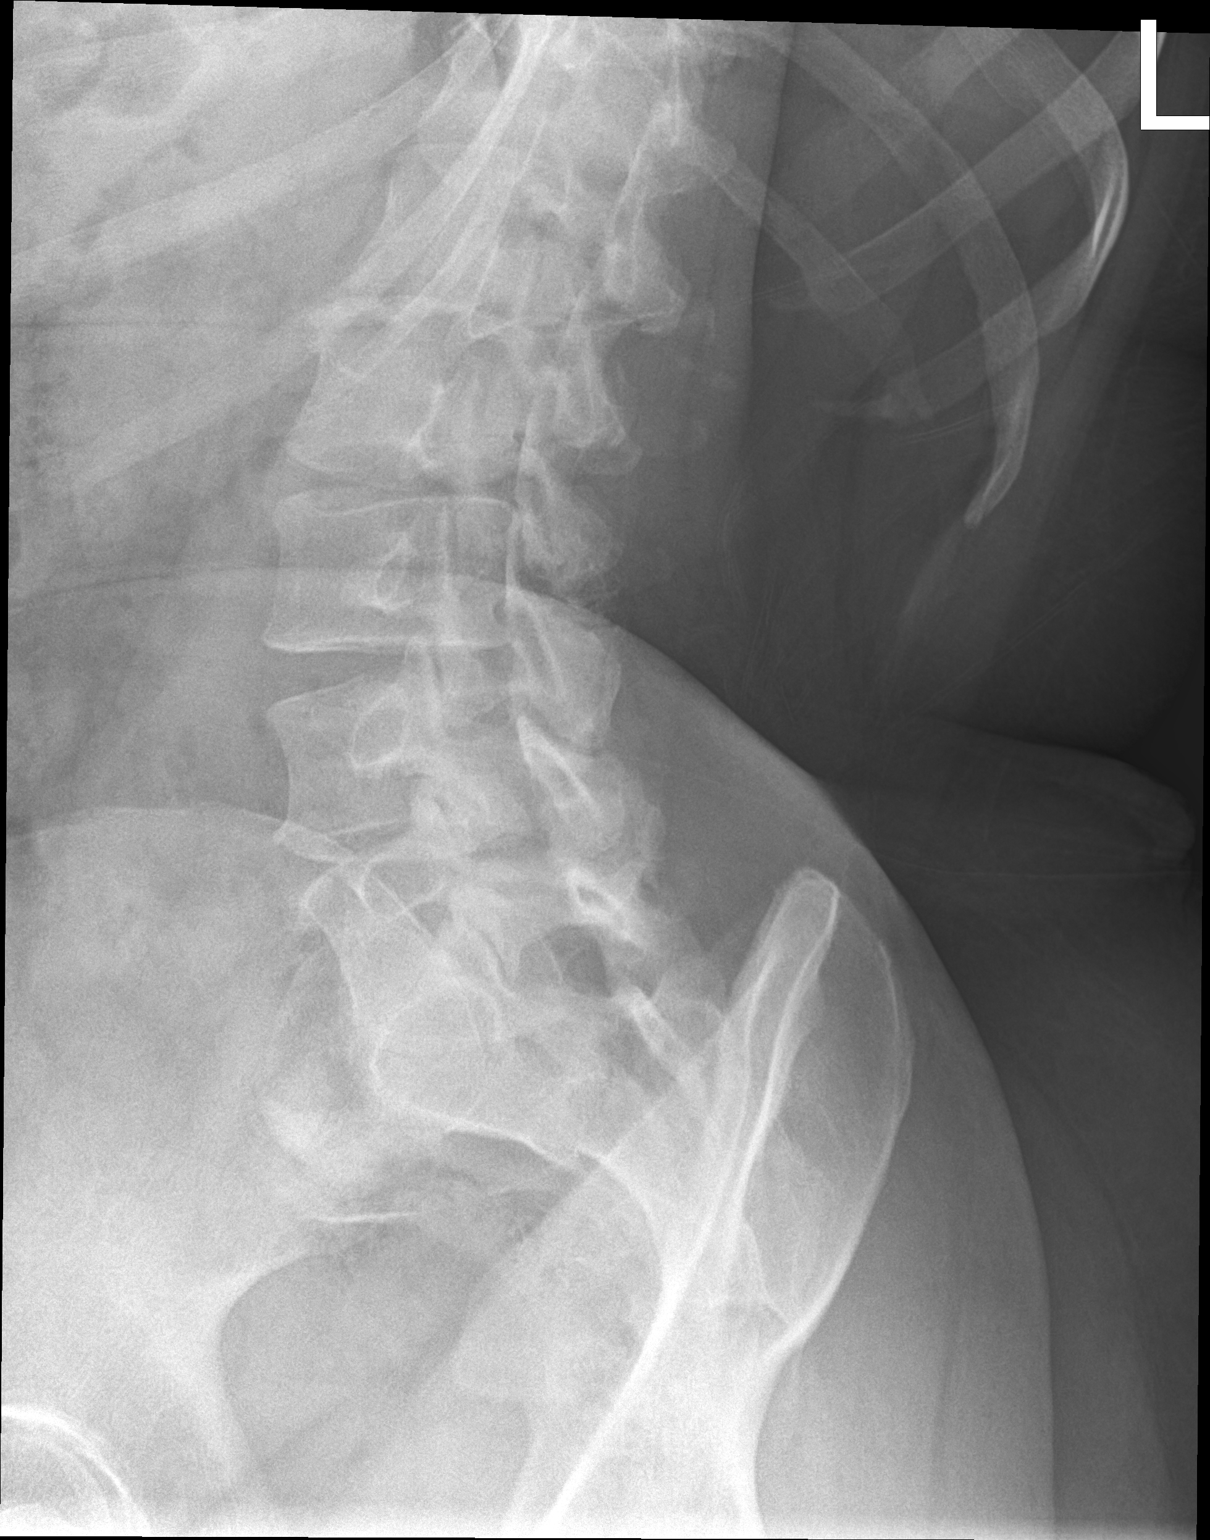

[2 of 2 positions shown; findings below may reference images not displayed]

FINDINGS: No evidence of acute bony abnormality or erosive arthropathy. Mild
sclerotic changes about the right SI joint cannot be completely
excluded. These changes may be degenerative. Mild degenerative
changes lumbar spine. AP view of the pelvis may prove useful for
complete evaluation. Pelvic calcifications noted consistent with
phleboliths.
IMPRESSION: 1. No evidence of acute bony abnormality or erosive arthropathy.

2. Mild sclerotic changes noted about the right SI joint, possibly
secondary to degenerative change. Mild degenerative changes lumbar
spine .

## 2019-07-27 ENCOUNTER — Ambulatory Visit: Payer: No Typology Code available for payment source | Attending: Internal Medicine

## 2019-07-27 DIAGNOSIS — Z23 Encounter for immunization: Secondary | ICD-10-CM

## 2019-07-27 NOTE — Progress Notes (Signed)
   Covid-19 Vaccination Clinic  Name:  Jasmine Franklin    MRN: [id number] DOB: [birth year]  07/27/2019  [name] was observed post Covid-19 immunization for 15 minutes without incident. She was provided with Vaccine Information Sheet and instruction to access the V-Safe system.   [name] was instructed to call 911 with any severe reactions post vaccine: Marland Kitchen Difficulty breathing  . Swelling of face and throat  . A fast heartbeat  . A bad rash all over body  . Dizziness and weakness   Immunizations Administered    Name Date Dose VIS Date Route   Pfizer COVID-19 Vaccine 07/27/2019  3:53 PM 0.3 mL 03/31/2019 Intramuscular   Manufacturer: [place]   Lot: [id number]   [place]: [id number]

## 2019-08-21 ENCOUNTER — Ambulatory Visit: Payer: No Typology Code available for payment source | Attending: Internal Medicine

## 2019-08-21 DIAGNOSIS — Z23 Encounter for immunization: Secondary | ICD-10-CM

## 2019-08-21 NOTE — Progress Notes (Signed)
   Covid-19 Vaccination Clinic  Name:  Jasmine Franklin    MRN: [id number] DOB: 01/23/1971  08/21/2019  Ms. Jasmine Franklin was observed post Covid-19 immunization for 15 minutes without incident. She was provided with Vaccine Information Sheet and instruction to access the V-Safe system.   Ms. Jasmine Franklin was instructed to call 911 with any severe reactions post vaccine: Marland Kitchen Difficulty breathing  . Swelling of face and throat  . A fast heartbeat  . A bad rash all over body  . Dizziness and weakness   Immunizations Administered    Name Date Dose VIS Date Route   Pfizer COVID-19 Vaccine 08/21/2019  9:21 AM 0.3 mL 06/14/2018 Intramuscular   Manufacturer: [place]   Lot: [id number]   [place]: [id number]

## 2019-09-20 ENCOUNTER — Emergency Department (HOSPITAL_COMMUNITY)
Admission: EM | Admit: 2019-09-20 | Discharge: 2019-09-20 | Disposition: A | Discharge status: Home / Self Care | Payer: Commercial Managed Care - PPO | Attending: Emergency Medicine | Admitting: Emergency Medicine

## 2019-09-20 ENCOUNTER — Encounter (HOSPITAL_COMMUNITY): Payer: Self-pay | Admitting: Emergency Medicine

## 2019-09-20 ENCOUNTER — Other Ambulatory Visit: Payer: Self-pay

## 2019-09-20 DIAGNOSIS — R202 Paresthesia of skin: Secondary | ICD-10-CM | POA: Diagnosis not present

## 2019-09-20 DIAGNOSIS — Z79899 Other long term (current) drug therapy: Secondary | ICD-10-CM | POA: Insufficient documentation

## 2019-09-20 DIAGNOSIS — I10 Essential (primary) hypertension: Secondary | ICD-10-CM

## 2019-09-20 DIAGNOSIS — R42 Dizziness and giddiness: Secondary | ICD-10-CM | POA: Diagnosis not present

## 2019-09-20 LAB — BASIC METABOLIC PANEL
Anion gap: 10 (ref 5–15)
BUN: 18 mg/dL (ref 6–20)
CO2: 26 mmol/L (ref 22–32)
Calcium: 8.7 mg/dL — ABNORMAL LOW (ref 8.9–10.3)
Chloride: 102 mmol/L (ref 98–111)
Creatinine, Ser: 0.89 mg/dL (ref 0.44–1.00)
GFR calc Af Amer: >60 mL/min (ref >60)
GFR calc non Af Amer: >60 mL/min (ref >60)
Glucose, Bld: 100 mg/dL — ABNORMAL HIGH (ref 70–99)
Potassium: 3.6 mmol/L (ref 3.5–5.1)
Sodium: 138 mmol/L (ref 135–145)

## 2019-09-20 LAB — CBC
HCT: 39.9 % (ref 36.0–46.0)
Hemoglobin: 13.5 g/dL (ref 12.0–15.0)
MCH: 29.8 pg (ref 26.0–34.0)
MCHC: 33.8 g/dL (ref 30.0–36.0)
MCV: 88.1 fL (ref 80.0–100.0)
Platelets: 331 10*3/uL (ref 150–400)
RBC: 4.53 MIL/uL (ref 3.87–5.11)
RDW: 12.7 % (ref 11.5–15.5)
WBC: 6 10*3/uL (ref 4.0–10.5)
nRBC: 0 % (ref 0.0–0.2)

## 2019-09-20 LAB — TROPONIN I (HIGH SENSITIVITY): Troponin I (High Sensitivity): 4 ng/L (ref <18)

## 2019-09-20 NOTE — Discharge Instructions (Addendum)
Follow-up with your doctor for further management of your blood pressure.

## 2019-09-20 NOTE — ED Triage Notes (Signed)
Patient here from home reporting hypertension per home BP machine and left sided arm numbness that started this morning. Reports taking Losartan with no relief.

## 2019-09-20 NOTE — ED Provider Notes (Signed)
[facility] DEPT Provider Note   CSN: [id number] Arrival date & time: 09/20/19  [id number]     History Chief Complaint  Patient presents with  . Hypertension  . Numbness    Jasmine Franklin is a 49 y.o. female.  HPI Patient presents with high blood pressure.  States she woke up feeling a little "dizzy".  States it felt almost like she is going to get a migraine.  States she took her migraine medicine and she never got a headache.  States that she checked her blood pressure and it was 160/100 patient waited an hour checked again is gone up more.  Checked again another hour and got up more.  States she came to the ER after that.  States she did feel little bit of tingling in her left arm.  No weakness.  No numbness.  No chest pain.  Does have a history of hypertension and took her blood pressure medicine this morning.  No confusion.  No vision changes.  No numbness weakness.    Past Medical History:  Diagnosis Date  . Slipped cervical disc 2011   pt did physical therapy- no longer having neck or left arm pain    Patient Active Problem List   Diagnosis Date Noted  . Endometriosis of fallopian tube 09/01/2012  . ELEVATED BLOOD PRESSURE WITHOUT DIAGNOSIS OF HYPERTENSION 12/10/2009  . HERNIATED CERVICAL DISC 10/11/2009  . NECK PAIN, LEFT 10/11/2009  . CERVICAL RADICULOPATHY, LEFT 10/11/2009    Past Surgical History:  Procedure Laterality Date  . CESAREAN SECTION    . LAPAROSCOPIC BILATERAL SALPINGECTOMY Bilateral 08/16/2012   Procedure: LAPAROSCOPIC BILATERAL SALPINGECTOMY;  Surgeon: Imagene Gurney A. Alycia Rossetti, MD;  Location: [facility];  Service: Gynecology;  Laterality: Bilateral;  . TUBAL LIGATION       OB History    Gravida  5   Para  5   Term      Preterm      AB      Living  5     SAB      TAB      Ectopic      Multiple      Live Births              Family History  Problem Relation Age of Onset  . Cancer Father        lymphoma'  .  Diabetes Brother   . Hypertension Paternal Grandmother   . Hypertension Paternal Grandfather     Social History   Tobacco Use  . Smoking status: Never Smoker  . Smokeless tobacco: Never Used  Substance Use Topics  . Alcohol use: No  . Drug use: No    Home Medications Prior to Admission medications   Medication Sig Start Date End Date Taking? Authorizing Provider  aspirin-acetaminophen-caffeine (EXCEDRIN MIGRAINE) [phone] MG tablet Take 2 tablets by mouth every 6 (six) hours as needed for headache or migraine.   Yes [provider]  losartan (COZAAR) 50 MG tablet Take 50 mg by mouth daily. 09/16/19  Yes [provider]  cyclobenzaprine (FLEXERIL) 10 MG tablet Take 0.5-1 tablets (5-10 mg total) by mouth 3 (three) times daily as needed. Patient not taking: Reported on 09/20/2019 05/24/17   Ivar Drape D, PA  meloxicam (MOBIC) 15 MG tablet TAKE 1 TABLET EVERY DAY Patient not taking: Reported on 09/20/2019 06/26/17   Ivar Drape D, PA    Allergies    Amoxicillin  Review of Systems   Review of  Systems  Constitutional: Negative for appetite change.  HENT: Negative for congestion.   Respiratory: Negative for shortness of breath.   Gastrointestinal: Negative for abdominal pain.  Genitourinary: Negative for flank pain.  Musculoskeletal: Negative for back pain.  Skin: Negative for rash.  Neurological: Positive for dizziness. Negative for headaches.  Psychiatric/Behavioral: Negative for confusion.    Physical Exam Updated Vital Signs BP (!) 166/93   Pulse 93   Temp 98.7 F (37.1 C) (Oral)   Resp 16   SpO2 98%   Physical Exam Vitals and nursing note reviewed.  HENT:     Head: Normocephalic.  Eyes:     Extraocular Movements: Extraocular movements intact.     Pupils: Pupils are equal, round, and reactive to light.  Cardiovascular:     Rate and Rhythm: Regular rhythm.  Pulmonary:     Breath sounds: No wheezing or rhonchi.  Abdominal:      Tenderness: There is no abdominal tenderness.     Hernia: No hernia is present.  Musculoskeletal:     Cervical back: Neck supple.     Right lower leg: No edema.     Left lower leg: No edema.  Skin:    General: Skin is warm.     Capillary Refill: Capillary refill takes less than 2 seconds.  Neurological:     Mental Status: She is alert and oriented to person, place, and time.     ED Results / Procedures / Treatments   Labs (all labs ordered are listed, but only abnormal results are displayed) Labs Reviewed  BASIC METABOLIC PANEL - Abnormal; Notable for the following components:      Result Value   Glucose, Bld 100 (*)    Calcium 8.7 (*)    All other components within normal limits  CBC  TROPONIN I (HIGH SENSITIVITY)  TROPONIN I (HIGH SENSITIVITY)    EKG EKG Interpretation  Date/Time:  Wednesday September 20 2019 10:30:50 EDT Ventricular Rate:  90 PR Interval:    QRS Duration: 74 QT Interval:  381 QTC Calculation: 467 R Axis:   39 Text Interpretation: Sinus rhythm Minimal ST depression, diffuse leads No significant change since last tracing Confirmed by Davonna Belling [phone]) on 09/20/2019 10:43:42 AM   Radiology No results found.  Procedures Procedures (including critical care time)  Medications Ordered in ED Medications - No data to display  ED Course  I have reviewed the triage vital signs and the nursing notes.  Pertinent labs & imaging results that were available during my care of the patient were reviewed by me and considered in my medical decision making (see chart for details).    MDM Rules/Calculators/A&P                      Patient with hypertension at home.  Checked the blood pressure is elevated continue to check it and continue to raise up.  Blood pressure somewhat improved now.  On antihypertensive medicine at baseline.  Did have tingling in her left arm.  EKG reassuring.  Troponin negative.  Doubt this is an anginal equivalent.  I think patient is  low enough risk that one troponin is enough.  Discharge home with outpatient follow-up. Final Clinical Impression(s) / ED Diagnoses Final diagnoses:  Hypertension, unspecified type    Rx / DC Orders ED Discharge Orders    None       Davonna Belling, MD 09/20/19 [phone]

## 2019-09-20 NOTE — ED Notes (Signed)
IV removed from R FA.

## 2022-01-19 ENCOUNTER — Other Ambulatory Visit: Payer: Self-pay | Admitting: Family Medicine

## 2022-01-19 DIAGNOSIS — Z1231 Encounter for screening mammogram for malignant neoplasm of breast: Secondary | ICD-10-CM

## 2022-02-16 ENCOUNTER — Other Ambulatory Visit: Payer: Self-pay

## 2022-02-16 ENCOUNTER — Encounter (HOSPITAL_COMMUNITY): Payer: Self-pay

## 2022-02-16 ENCOUNTER — Emergency Department (HOSPITAL_COMMUNITY)
Admission: EM | Admit: 2022-02-16 | Discharge: 2022-02-16 | Disposition: A | Discharge status: Home / Self Care | Payer: Commercial Managed Care - PPO | Attending: Emergency Medicine | Admitting: Emergency Medicine

## 2022-02-16 ENCOUNTER — Emergency Department (HOSPITAL_COMMUNITY): Payer: Commercial Managed Care - PPO

## 2022-02-16 DIAGNOSIS — K5792 Diverticulitis of intestine, part unspecified, without perforation or abscess without bleeding: Secondary | ICD-10-CM | POA: Insufficient documentation

## 2022-02-16 DIAGNOSIS — Z7982 Long term (current) use of aspirin: Secondary | ICD-10-CM | POA: Insufficient documentation

## 2022-02-16 DIAGNOSIS — I10 Essential (primary) hypertension: Secondary | ICD-10-CM | POA: Insufficient documentation

## 2022-02-16 DIAGNOSIS — R1011 Right upper quadrant pain: Secondary | ICD-10-CM | POA: Diagnosis present

## 2022-02-16 DIAGNOSIS — Z79899 Other long term (current) drug therapy: Secondary | ICD-10-CM | POA: Diagnosis not present

## 2022-02-16 DIAGNOSIS — N289 Disorder of kidney and ureter, unspecified: Secondary | ICD-10-CM | POA: Insufficient documentation

## 2022-02-16 HISTORY — DX: Essential (primary) hypertension: I10

## 2022-02-16 LAB — URINALYSIS, ROUTINE W REFLEX MICROSCOPIC
Bilirubin Urine: NEGATIVE
Glucose, UA: NEGATIVE mg/dL
Ketones, ur: NEGATIVE mg/dL
Leukocytes,Ua: NEGATIVE
Nitrite: NEGATIVE
Protein, ur: NEGATIVE mg/dL
Specific Gravity, Urine: 1.019 (ref 1.005–1.030)
pH: 6 (ref 5.0–8.0)

## 2022-02-16 LAB — COMPREHENSIVE METABOLIC PANEL
ALT: 16 U/L (ref 0–44)
AST: 22 U/L (ref 15–41)
Albumin: 3.7 g/dL (ref 3.5–5.0)
Alkaline Phosphatase: 63 U/L (ref 38–126)
Anion gap: 8 (ref 5–15)
BUN: 17 mg/dL (ref 6–20)
CO2: 27 mmol/L (ref 22–32)
Calcium: 9 mg/dL (ref 8.9–10.3)
Chloride: 103 mmol/L (ref 98–111)
Creatinine, Ser: 0.9 mg/dL (ref 0.44–1.00)
GFR, Estimated: >60 mL/min (ref >60)
Glucose, Bld: 76 mg/dL (ref 70–99)
Potassium: 3.6 mmol/L (ref 3.5–5.1)
Sodium: 138 mmol/L (ref 135–145)
Total Bilirubin: 0.8 mg/dL (ref 0.3–1.2)
Total Protein: 7 g/dL (ref 6.5–8.1)

## 2022-02-16 LAB — LIPASE, BLOOD: Lipase: 28 U/L (ref 11–51)

## 2022-02-16 LAB — CBC
HCT: 40.8 % (ref 36.0–46.0)
Hemoglobin: 13.3 g/dL (ref 12.0–15.0)
MCH: 30.1 pg (ref 26.0–34.0)
MCHC: 32.6 g/dL (ref 30.0–36.0)
MCV: 92.3 fL (ref 80.0–100.0)
Platelets: 340 10*3/uL (ref 150–400)
RBC: 4.42 MIL/uL (ref 3.87–5.11)
RDW: 12.4 % (ref 11.5–15.5)
WBC: 9.4 10*3/uL (ref 4.0–10.5)
nRBC: 0 % (ref 0.0–0.2)

## 2022-02-16 LAB — I-STAT BETA HCG BLOOD, ED (MC, WL, AP ONLY): I-stat hCG, quantitative: <5 m[IU]/mL (ref <5)

## 2022-02-16 MED ORDER — CIPROFLOXACIN HCL 500 MG PO TABS
500.0000 mg | ORAL_TABLET | Freq: Once | ORAL | Status: AC
  Administered 2022-02-16: 500 mg via ORAL
  Filled 2022-02-16: qty 1

## 2022-02-16 MED ORDER — METRONIDAZOLE 500 MG PO TABS: 500.0000 mg | ORAL_TABLET | Freq: Two times a day (BID) | ORAL | 0 refills | Status: DC

## 2022-02-16 MED ORDER — OXYCODONE-ACETAMINOPHEN 5-325 MG PO TABS: 1.0000 | ORAL_TABLET | Freq: Four times a day (QID) | ORAL | 0 refills | Status: DC | PRN

## 2022-02-16 MED ORDER — IOHEXOL 350 MG/ML SOLN
75.0000 mL | Freq: Once | INTRAVENOUS | Status: AC | PRN
  Administered 2022-02-16: 75 mL via INTRAVENOUS

## 2022-02-16 MED ORDER — METRONIDAZOLE 500 MG PO TABS
500.0000 mg | ORAL_TABLET | Freq: Once | ORAL | Status: AC
  Administered 2022-02-16: 500 mg via ORAL
  Filled 2022-02-16: qty 1

## 2022-02-16 MED ORDER — CIPROFLOXACIN HCL 500 MG PO TABS: 500.0000 mg | ORAL_TABLET | Freq: Two times a day (BID) | ORAL | 0 refills | Status: DC

## 2022-02-16 MED ORDER — OXYCODONE-ACETAMINOPHEN 5-325 MG PO TABS
1.0000 | ORAL_TABLET | Freq: Once | ORAL | Status: AC
  Administered 2022-02-16: 1 via ORAL
  Filled 2022-02-16: qty 1

## 2022-02-16 MED ORDER — ONDANSETRON 4 MG PO TBDP: ORAL_TABLET | ORAL | 0 refills | Status: DC

## 2022-02-16 NOTE — ED Triage Notes (Signed)
Reports abd pain x 3 days.  Went to PCP and sent here for rule out appy or gallbladder.  Denies n/v.  Denies pain is worse after eating but has had loss of appetitie.  Patient reports when palpating hurts in RLQ but constantly in epigastric region.

## 2022-02-16 NOTE — ED Provider Notes (Signed)
[facility] EMERGENCY DEPARTMENT Provider Note   CSN: [id number] Arrival date & time: 02/16/22  1032     History  Chief Complaint  Patient presents with   Abdominal Pain    Chrystine L Belitz is a 51 y.o. female hx of hypertension here presenting with abdominal pain.  Patient has been having right upper quadrant and right lower quadrant pain for the last several days. Patient went to see PCP today and was sent in for further evaluation.  Denies any vomiting or fevers.  The history is provided by the patient.       Home Medications Prior to Admission medications   Medication Sig Start Date End Date Taking? Authorizing Provider  aspirin-acetaminophen-caffeine (EXCEDRIN MIGRAINE) [phone] MG tablet Take 2 tablets by mouth every 6 (six) hours as needed for headache or migraine.    [provider]  cyclobenzaprine (FLEXERIL) 10 MG tablet Take 0.5-1 tablets (5-10 mg total) by mouth 3 (three) times daily as needed. Patient not taking: Reported on 09/20/2019 05/24/17   Ivar Drape D, PA  losartan (COZAAR) 50 MG tablet Take 50 mg by mouth daily. 09/16/19   [provider]  meloxicam (MOBIC) 15 MG tablet TAKE 1 TABLET EVERY DAY Patient not taking: Reported on 09/20/2019 06/26/17   Ivar Drape D, PA      Allergies    Amoxicillin    Review of Systems   Review of Systems  Gastrointestinal:  Positive for abdominal pain.  All other systems reviewed and are negative.   Physical Exam Updated Vital Signs BP (!) 182/93   Pulse 78   Temp 98.2 F (36.8 C) (Oral)   Resp 18   Ht '5\' 5"'$  (1.651 m)   Wt 102 kg   SpO2 100%   BMI 37.42 kg/m  Physical Exam Vitals and nursing note reviewed.  Constitutional:      Comments: Uncomfortable  HENT:     Head: Normocephalic.     Mouth/Throat:     Mouth: Mucous membranes are moist.     Pharynx: Oropharynx is clear.  Eyes:     Extraocular Movements: Extraocular movements intact.     Pupils: Pupils are  equal, round, and reactive to light.  Cardiovascular:     Rate and Rhythm: Normal rate and regular rhythm.     Heart sounds: Normal heart sounds.  Pulmonary:     Effort: Pulmonary effort is normal.     Breath sounds: Normal breath sounds.  Abdominal:     General: Abdomen is flat.     Comments: Mild right upper quadrant and right lower quadrant tenderness.  Skin:    General: Skin is warm.     Capillary Refill: Capillary refill takes less than 2 seconds.  Neurological:     General: No focal deficit present.     Mental Status: She is alert and oriented to person, place, and time.  Psychiatric:        Mood and Affect: Mood normal.        Behavior: Behavior normal.     ED Results / Procedures / Treatments   Labs (all labs ordered are listed, but only abnormal results are displayed) Labs Reviewed  URINALYSIS, ROUTINE W REFLEX MICROSCOPIC - Abnormal; Notable for the following components:      Result Value   APPearance CLOUDY (*)    Hgb urine dipstick MODERATE (*)    Bacteria, UA FEW (*)    All other components within normal limits  CBC  COMPREHENSIVE METABOLIC  PANEL  LIPASE, BLOOD  I-STAT BETA HCG BLOOD, ED ([facility], [facility], [facility] ONLY)    EKG None  Radiology No results found.  Procedures Procedures    Medications Ordered in ED Medications  oxyCODONE-acetaminophen (PERCOCET/ROXICET) 5-325 MG per tablet 1 tablet (1 tablet Oral Given 02/16/22 1827)  iohexol (OMNIPAQUE) 350 MG/ML injection 75 mL (75 mLs Intravenous Contrast Given 02/16/22 1825)    ED Course/ Medical Decision Making/ A&P                           Medical Decision Making Momoka L Calzada is a 51 y.o. female here presenting with abdominal pain.  Right upper quadrant and right lower quadrant pain for about 3 days.  Consider biliary colic versus gastritis versus appendicitis.  Plan to get CBC and CMP and lipase and CT abdomen pelvis and UA.  7:10 PM I reviewed patient's labs and independently interpreted CT scan.   Patient has cecal diverticulitis with no obvious perforation.  Patient does have a renal lesion that needs to be followed up.  Patient is allergic to amoxicillin so I prescribed Cipro and Flagyl and pain medicine.  I recommend that she follows up with GI for a colonoscopy.  Problems Addressed: Diverticulitis: acute illness or injury Renal lesion: undiagnosed new problem with uncertain prognosis  Amount and/or Complexity of Data Reviewed Labs: ordered. Decision-making details documented in ED Course. Radiology: ordered and independent interpretation performed. Decision-making details documented in ED Course.  Risk Prescription drug management.    Final Clinical Impression(s) / ED Diagnoses Final diagnoses:  None    Rx / DC Orders ED Discharge Orders     None         Drenda Freeze, MD 02/16/22 [phone]

## 2022-02-16 NOTE — Discharge Instructions (Addendum)
Take Cipro and Flagyl as prescribed for diverticulitis  Take Zofran if you have nausea.  As we discussed please avoid drinking any alcohol while you are taking the antibiotics  Take Percocet for severe pain  I referred you to GI doctor to get a colonoscopy.  You also have a lesion on your kidney and that needs to be followed up with your primary care doctor  Return to ER if you have worse abdominal pain, vomiting, fever

## 2022-02-16 NOTE — ED Provider Triage Note (Signed)
Emergency Medicine Provider Triage Evaluation Note  Jasmine Franklin , a 51 y.o. female  was evaluated in triage.  Patient complains of epigastric pain.  Says been going on for 3 days.  Today she saw her PCP who said it may be her gallbladder and sent her here.  No nausea, vomiting or diarrhea.  No history of abdominal surgery  Review of Systems  Positive: Abdominal pain Negative:   Physical Exam  BP (!) 155/101 (BP Location: Right Arm)   Pulse 86   Temp 98.5 F (36.9 C)   Resp 17   Ht '5\' 5"'$  (1.651 m)   Wt 102.1 kg   SpO2 99%   BMI 37.44 kg/m  Gen:   Awake, no distress   Resp:  Normal effort  MSK:   Moves extremities without difficulty  Other:  Epigastric tenderness however also with right upper and right lower quadrant tenderness  Medical Decision Making  Medically screening exam initiated at 12:52 PM.  Appropriate orders placed.  Jasmine Franklin was informed that the remainder of the evaluation will be completed by another provider, this initial triage assessment does not replace that evaluation, and the importance of remaining in the ED until their evaluation is complete.     Jasmine Hammock, PA-C 02/16/22 1253

## 2022-02-18 ENCOUNTER — Encounter: Payer: Self-pay | Admitting: Nurse Practitioner

## 2022-03-26 ENCOUNTER — Ambulatory Visit: Payer: No Typology Code available for payment source | Admitting: Nurse Practitioner

## 2022-03-27 ENCOUNTER — Ambulatory Visit: Payer: No Typology Code available for payment source | Admitting: Nurse Practitioner

## 2022-03-27 NOTE — Progress Notes (Deleted)
03/27/2022 Jasmine Franklin [id number] 1970-12-20   CHIEF COMPLAINT: Diverticulitis   HISTORY OF PRESENT ILLNESS: Jasmine Franklin is a 51 year old female with a past medical history of hypertension  She presents to our office today as referred by Dr. Darl Householder for further evaluation regarding diverticulitis.  She was prescribed a course of Cipro and Flagyl with recommendations for GI follow up and to schedule a colonoscopy.      Latest Ref Rng & Units 02/16/2022    1:08 PM 09/20/2019   10:47 AM 05/06/2017    6:28 AM  CBC  WBC 4.0 - 10.5 K/uL 9.4  6.0  8.2   Hemoglobin 12.0 - 15.0 g/dL 13.3  13.5  14.1   Hematocrit 36.0 - 46.0 % 40.8  39.9  41.7   Platelets 150 - 400 K/uL 340  331  326        Latest Ref Rng & Units 02/16/2022    2:41 PM 09/20/2019   10:47 AM 05/06/2017    6:28 AM  CMP  Glucose 70 - 99 mg/dL 76  100  88   BUN 6 - 20 mg/dL '17  18  18   '$ Creatinine 0.44 - 1.00 mg/dL 0.90  0.89  0.88   Sodium 135 - 145 mmol/L 138  138  137   Potassium 3.5 - 5.1 mmol/L 3.6  3.6  3.2   Chloride 98 - 111 mmol/L 103  102  103   CO2 22 - 32 mmol/L '27  26  22   '$ Calcium 8.9 - 10.3 mg/dL 9.0  8.7  9.2   Total Protein 6.5 - 8.1 g/dL 7.0     Total Bilirubin 0.3 - 1.2 mg/dL 0.8     Alkaline Phos 38 - 126 U/L 63     AST 15 - 41 U/L 22     ALT 0 - 44 U/L 16        CTAP with contrast 02/16/2022:   TECHNIQUE: Multidetector CT imaging of the abdomen and pelvis was performed using the standard protocol following bolus administration of intravenous contrast.   RADIATION DOSE REDUCTION: This exam was performed according to the departmental dose-optimization program which includes automated exposure control, adjustment of the mA and/or kV according to patient size and/or use of iterative reconstruction technique.   CONTRAST:  85m OMNIPAQUE IOHEXOL 350 MG/ML SOLN   COMPARISON:  CT abdomen/pelvis 06/28/2012.   FINDINGS: Lower chest: The lung bases are clear. The imaged heart  is unremarkable.   Hepatobiliary: A subcentimeter hypodense lesion in the left hepatic lobe is too small to characterize but likely reflects a benign cyst for which no specific imaging follow-up is required. The liver is otherwise unremarkable. The gallbladder is unremarkable. There is no evidence of cholecystitis. There is no biliary ductal dilatation.   Pancreas: Unremarkable.   Spleen: Unremarkable.   Adrenals/Urinary Tract: There is 1.0 cm right adrenal lesion (7-65). The left adrenal is unremarkable.   There is a partially exophytic lesion in the left kidney measuring approximately 3.6 cm x 1.8 cm in the axial plane, not present in 2014. There are no other focal lesions. There are no stones. There is no hydronephrosis or hydroureter. The bladder is decompressed but grossly unremarkable.   Stomach/Bowel: There is a small hiatal hernia. The stomach is otherwise unremarkable. There is no evidence of bowel obstruction. There is inflammatory fat stranding in the right lower quadrant adjacent to the terminal ileum in the region of a posteriorly inferiorly  projecting diverticulum (3-66, 7-44). The appendix normal is in appearance and appears separate from the above-described inflammation.   Vascular/Lymphatic: The abdominal aorta is normal in course and caliber. The major branch vessels are patent. The main portal and splenic veins are patent. There is no abdominal or pelvic lymphadenopathy.   Reproductive: Uterus and adnexa are unremarkable.   Other: There is no ascites or abscess. There is no free intraperitoneal air.   Musculoskeletal: There is no acute osseous abnormality or suspicious osseous lesion.   IMPRESSION: 1. Inflammatory change in the right lower quadrant adjacent to a posterior inferiorly projecting cecal diverticulum likely reflecting mild acute cecal diverticulitis without evidence of perforation or abscess. The appendix is identified separate from the  inflammation and appears normal. 2. Partially exophytic renal lesion suspicious for neoplasm. Recommend MRI of the abdomen with and without contrast for characterization. An indeterminate 1.0 cm right adrenal lesion can also be assessed at this time.       Past Medical History:  Diagnosis Date   Hypertension    Slipped cervical disc 04/20/2009   pt did physical therapy- no longer having neck or left arm pain   Past Surgical History:  Procedure Laterality Date   CESAREAN SECTION     LAPAROSCOPIC BILATERAL SALPINGECTOMY Bilateral 08/16/2012   Procedure: LAPAROSCOPIC BILATERAL SALPINGECTOMY;  Surgeon: Imagene Gurney A. Alycia Rossetti, MD;  Location: [facility];  Service: Gynecology;  Laterality: Bilateral;   TUBAL LIGATION     Social History:  Family History:    reports that she has never smoked. She has never used smokeless tobacco. She reports that she does not drink alcohol and does not use drugs. family history includes Cancer in her father; Diabetes in her brother; Hypertension in her paternal grandfather and paternal grandmother.  Allergies  Allergen Reactions   Amoxicillin Hives and Itching      Outpatient Encounter Medications as of 03/27/2022  Medication Sig   aspirin-acetaminophen-caffeine (EXCEDRIN MIGRAINE) 250-250-65 MG tablet Take 2 tablets by mouth every 6 (six) hours as needed for headache or migraine.   ciprofloxacin (CIPRO) 500 MG tablet Take 1 tablet (500 mg total) by mouth every 12 (twelve) hours.   cyclobenzaprine (FLEXERIL) 10 MG tablet Take 0.5-1 tablets (5-10 mg total) by mouth 3 (three) times daily as needed. (Patient not taking: Reported on 09/20/2019)   losartan (COZAAR) 50 MG tablet Take 50 mg by mouth daily.   meloxicam (MOBIC) 15 MG tablet TAKE 1 TABLET EVERY DAY (Patient not taking: Reported on 09/20/2019)   metroNIDAZOLE (FLAGYL) 500 MG tablet Take 1 tablet (500 mg total) by mouth 2 (two) times daily. One po bid x 7 days   ondansetron (ZOFRAN-ODT) 4 MG disintegrating  tablet '4mg'$  ODT q4 hours prn nausea/vomit   oxyCODONE-acetaminophen (PERCOCET) 5-325 MG tablet Take 1 tablet by mouth every 6 (six) hours as needed.   No facility-administered encounter medications on file as of 03/27/2022.     REVIEW OF SYSTEMS:  Gen: Denies fever, sweats or chills. No weight loss.  CV: Denies chest pain, palpitations or edema. Resp: Denies cough, shortness of breath of hemoptysis.  GI: Denies heartburn, dysphagia, stomach or lower abdominal pain. No diarrhea or constipation.  GU : Denies urinary burning, blood in urine, increased urinary frequency or incontinence. MS: Denies joint pain, muscles aches or weakness. Derm: Denies rash, itchiness, skin lesions or unhealing ulcers. Psych: Denies depression, anxiety, memory loss, suicidal ideation and confusion. Heme: Denies bruising, easy bleeding. Neuro:  Denies headaches, dizziness or paresthesias. Endo:  Denies any  problems with DM, thyroid or adrenal function.  PHYSICAL EXAM: There were no vitals taken for this visit. General: Well developed ... in no acute distress. Head: Normocephalic and atraumatic. Eyes:  Sclerae non-icteric, conjunctive pink. Ears: Normal auditory acuity. Mouth: Dentition intact. No ulcers or lesions.  Neck: Supple, no lymphadenopathy or thyromegaly.  Lungs: Clear bilaterally to auscultation without wheezes, crackles or rhonchi. Heart: Regular rate and rhythm. No murmur, rub or gallop appreciated.  Abdomen: Soft, nontender, non distended. No masses. No hepatosplenomegaly. Normoactive bowel sounds x 4 quadrants.  Rectal:  Musculoskeletal: Symmetrical with no gross deformities. Skin: Warm and dry. No rash or lesions on visible extremities. Extremities: No edema. Neurological: Alert oriented x 4, no focal deficits.  Psychological:  Alert and cooperative. Normal mood and affect.  ASSESSMENT AND PLAN:  10) 51 year old female with diverticulitis   2) Renal lesion   Adrenals/Urinary Tract:  There is 1.0 cm right adrenal lesion (7-65). The left adrenal is unremarkable.   There is a partially exophytic lesion in the left kidney measuring approximately 3.6 cm x 1.8 cm in the axial plane, not present in 2014. There are no other focal lesions. There are no stones. There is no hydronephrosis or hydroureter. The bladder is decompressed but grossly unremarkable.   Stomach/Bowel: There is a small hiatal hernia. The stomach is otherwise unremarkable. There is no evidence of bowel obstruction. There is inflammatory fat stranding in the right lower quadrant adjacent to the terminal ileum in the region of a posteriorly inferiorly projecting diverticulum (3-66, 7-44). The appendix normal is in appearance and appears separate from the above-described inflammation.   Vascular/Lymphatic: The abdominal aorta is normal in course and caliber. The major branch vessels are patent. The main portal and splenic veins are patent. There is no abdominal or pelvic lymphadenopathy.   Reproductive: Uterus and adnexa are unremarkable.   Other: There is no ascites or abscess. There is no free intraperitoneal air.   Musculoskeletal: There is no acute osseous abnormality or suspicious osseous lesion.   IMPRESSION: 1. Inflammatory change in the right lower quadrant adjacent to a posterior inferiorly projecting cecal diverticulum likely reflecting mild acute cecal diverticulitis without evidence of perforation or abscess. The appendix is identified separate from the inflammation and appears normal. 2. Partially exophytic renal lesion suspicious for neoplasm. Recommend MRI of the abdomen with and without contrast for characterization. An indeterminate 1.0 cm right adrenal lesion can also be assessed at this time.        CC:  [facility]*

## 2022-10-05 ENCOUNTER — Other Ambulatory Visit: Payer: Self-pay

## 2022-10-05 ENCOUNTER — Encounter (HOSPITAL_COMMUNITY): Payer: Self-pay | Admitting: *Deleted

## 2022-10-05 ENCOUNTER — Emergency Department (HOSPITAL_COMMUNITY)
Admission: EM | Admit: 2022-10-05 | Discharge: 2022-10-05 | Disposition: A | Discharge status: Home / Self Care | Payer: Commercial Managed Care - PPO | Attending: Emergency Medicine | Admitting: Emergency Medicine

## 2022-10-05 DIAGNOSIS — R350 Frequency of micturition: Secondary | ICD-10-CM | POA: Insufficient documentation

## 2022-10-05 DIAGNOSIS — R109 Unspecified abdominal pain: Secondary | ICD-10-CM | POA: Diagnosis present

## 2022-10-05 DIAGNOSIS — M545 Low back pain, unspecified: Secondary | ICD-10-CM | POA: Insufficient documentation

## 2022-10-05 DIAGNOSIS — R300 Dysuria: Secondary | ICD-10-CM | POA: Diagnosis not present

## 2022-10-05 DIAGNOSIS — M549 Dorsalgia, unspecified: Secondary | ICD-10-CM

## 2022-10-05 LAB — URINALYSIS, ROUTINE W REFLEX MICROSCOPIC
Glucose, UA: NEGATIVE mg/dL
Ketones, ur: 5 mg/dL — AB
Leukocytes,Ua: NEGATIVE
Nitrite: NEGATIVE
Protein, ur: 100 mg/dL — AB
Specific Gravity, Urine: 1.045 — ABNORMAL HIGH (ref 1.005–1.030)
pH: 5 (ref 5.0–8.0)

## 2022-10-05 LAB — CBC
HCT: 41.3 % (ref 36.0–46.0)
Hemoglobin: 13.8 g/dL (ref 12.0–15.0)
MCH: 29.6 pg (ref 26.0–34.0)
MCHC: 33.4 g/dL (ref 30.0–36.0)
MCV: 88.6 fL (ref 80.0–100.0)
Platelets: 336 10*3/uL (ref 150–400)
RBC: 4.66 MIL/uL (ref 3.87–5.11)
RDW: 12.5 % (ref 11.5–15.5)
WBC: 10.4 10*3/uL (ref 4.0–10.5)
nRBC: 0 % (ref 0.0–0.2)

## 2022-10-05 LAB — BASIC METABOLIC PANEL
Anion gap: 15 (ref 5–15)
BUN: 19 mg/dL (ref 6–20)
CO2: 25 mmol/L (ref 22–32)
Calcium: 9.2 mg/dL (ref 8.9–10.3)
Chloride: 100 mmol/L (ref 98–111)
Creatinine, Ser: 0.96 mg/dL (ref 0.44–1.00)
GFR, Estimated: >60 mL/min (ref >60)
Glucose, Bld: 92 mg/dL (ref 70–99)
Potassium: 3.4 mmol/L — ABNORMAL LOW (ref 3.5–5.1)
Sodium: 140 mmol/L (ref 135–145)

## 2022-10-05 MED ORDER — KETOROLAC TROMETHAMINE 15 MG/ML IJ SOLN
15.0000 mg | Freq: Once | INTRAMUSCULAR | Status: AC
  Administered 2022-10-05: 15 mg via INTRAMUSCULAR

## 2022-10-05 MED ORDER — CYCLOBENZAPRINE HCL 10 MG PO TABS: 10.0000 mg | ORAL_TABLET | Freq: Every day | ORAL | 0 refills | Status: DC

## 2022-10-05 MED ORDER — CELECOXIB 200 MG PO CAPS: 200.0000 mg | ORAL_CAPSULE | Freq: Two times a day (BID) | ORAL | 0 refills | Status: AC

## 2022-10-05 MED ORDER — ACETAMINOPHEN 500 MG PO TABS
1000.0000 mg | ORAL_TABLET | Freq: Once | ORAL | Status: AC
  Administered 2022-10-05: 1000 mg via ORAL
  Filled 2022-10-05: qty 2

## 2022-10-05 MED ORDER — KETOROLAC TROMETHAMINE 15 MG/ML IJ SOLN
15.0000 mg | Freq: Once | INTRAMUSCULAR | Status: DC
  Filled 2022-10-05: qty 1

## 2022-10-05 NOTE — ED Triage Notes (Signed)
Patient c/o left flank pain onset 4 days ago , states pain radiates into her left lower abd. , sat took 800 mg Motrin used a heating pad and rested felt better , states pain the pain started ago. Denies n/v or urinary sx. C/o sharp shooting pain in her left hand this am and it scared her so she came to be checked out.

## 2022-10-05 NOTE — ED Provider Notes (Signed)
[place] EMERGENCY DEPARTMENT AT [facility] Provider Note   CSN: [id number] Arrival date & time: 10/05/22  0865     History Chief Complaint  Patient presents with   Flank Pain    HPI Luma L Padmore is a 52 y.o. female presenting for 4 days of left-sided flank pain. She endorses dysuria urinary frequency otherwise ambulatory tolerating p.o. intake. Denies fevers chills nausea vomiting, syncope or shortness of breath.  No history of back pain similar to this in the past.   Patient's recorded medical, surgical, social, medication list and allergies were reviewed in the Snapshot window as part of the initial history.   Review of Systems   Review of Systems  Constitutional:  Negative for chills and fever.  HENT:  Negative for ear pain and sore throat.   Eyes:  Negative for pain and visual disturbance.  Respiratory:  Negative for cough and shortness of breath.   Cardiovascular:  Negative for chest pain and palpitations.  Gastrointestinal:  Negative for abdominal pain and vomiting.  Genitourinary:  Positive for flank pain. Negative for dysuria and hematuria.  Musculoskeletal:  Negative for arthralgias and back pain.  Skin:  Negative for color change and rash.  Neurological:  Negative for seizures and syncope.  All other systems reviewed and are negative.   Physical Exam Updated Vital Signs BP 135/89 (BP Location: Right Arm)   Pulse 98   Temp 98.7 F (37.1 C) (Oral)   Resp 18   Ht 5' 6.5" (1.689 m)   Wt 103 kg   SpO2 100%   BMI 36.09 kg/m  Physical Exam Vitals and nursing note reviewed.  Constitutional:      General: She is not in acute distress.    Appearance: She is well-developed.  HENT:     Head: Normocephalic and atraumatic.  Eyes:     Conjunctiva/sclera: Conjunctivae normal.  Cardiovascular:     Rate and Rhythm: Normal rate and regular rhythm.     Heart sounds: No murmur heard. Pulmonary:     Effort: Pulmonary effort is normal. No respiratory  distress.     Breath sounds: Normal breath sounds.  Abdominal:     General: There is no distension.     Palpations: Abdomen is soft.     Tenderness: There is no abdominal tenderness. There is no right CVA tenderness or left CVA tenderness.  Musculoskeletal:        General: No swelling or tenderness. Normal range of motion.     Cervical back: Neck supple.  Skin:    General: Skin is warm and dry.  Neurological:     General: No focal deficit present.     Mental Status: She is alert and oriented to person, place, and time. Mental status is at baseline.     Cranial Nerves: No cranial nerve deficit.      ED Course/ Medical Decision Making/ A&P    Procedures Procedures   Medications Ordered in ED Medications  acetaminophen (TYLENOL) tablet 1,000 mg (1,000 mg Oral Given 10/05/22 1221)  ketorolac (TORADOL) 15 MG/ML injection 15 mg (15 mg Intramuscular Given 10/05/22 1221)   Medical Decision Making:   Lanisha SHERRISE DOWNUM is a 52 y.o. female who presented to the ED today with acute lower back pain over the past 96 hours, detailed above.    Patient placed on continuous vitals and telemetry monitoring while in ED which was reviewed periodically.   On my initial exam, the pt was with an intact neurologic exam,  tolerating ambulation with an antalgic gait and p.o. intake without difficulty.  Patient had no abnormal DTRs, no midline spinal tenderness.  Patient endorsing complete sensation of the perineum.  Patient without episodes of fecal or urinary incontinence.  Patient has no focal neurologic deficits and reassuring vital signs at this time.  No obvious physical abnormality or injury on exam. Notably, patient denies recent trauma, is afebrile, and denies IVDU.   Reviewed and confirmed nursing documentation for past medical history, family history, social history.    Initial Assessment:   With the patient's presentation of acute back pain in the above setting, most likely diagnosis is  musculoskeletal strain. Other diagnoses were considered including (but not limited to) underlying fracture, epidural hematoma, cauda equina syndrome, spinal stenosis, spinal malignancy. These are considered less likely due to history of present illness and physical exam findings.   In particular, lack of fever, substantial history of IV drug use, or substantial neurologic abnormality is less consistent with epidural abscess versus discitis or other spinal infection. In particular,  Initial Plan:  Multimodal pain control described and patient informed on safe usage.  Screening evaluation including below radiographic evaluation reviewed as well as screening laboratory evaluation to evaluate for renal pathology or metabolic abnormality and grossly unremarkable at this time. Patient stable for continued outpatient evaluation and management of their musculoskeletal pains.  Patient referred back to primary care provider for continued evaluation and management.   Disposition:   Based on the above findings, I believe patient is stable for discharge.    Patient and family educated about specific return precautions for given chief complaint and symptoms.  Patient and family educated about follow-up with PCP.  Patient and family expressed understanding of return precautions and need for follow-up. Patient spoken to regarding all imaging and laboratory results and appropriate follow up for these results. All education provided in verbal and written form and time was allowed for answering of patient questions. Patient discharged.          Emergency Department Medication Summary:   Medications  acetaminophen (TYLENOL) tablet 1,000 mg (1,000 mg Oral Given 10/05/22 1221)  ketorolac (TORADOL) 15 MG/ML injection 15 mg (15 mg Intramuscular Given 10/05/22 1221)      Clinical Impression:  1. Acute left-sided back pain, unspecified back location      Discharge   Final Clinical Impression(s) / ED  Diagnoses Final diagnoses:  Acute left-sided back pain, unspecified back location    Rx / DC Orders ED Discharge Orders          Ordered    celecoxib (CELEBREX) 200 MG capsule  2 times daily        10/05/22 1331    cyclobenzaprine (FLEXERIL) 10 MG tablet  Daily at bedtime        10/05/22 1331              Glyn Ade, MD 10/05/22 1332

## 2023-06-21 ENCOUNTER — Emergency Department (HOSPITAL_COMMUNITY)
Admission: EM | Admit: 2023-06-21 | Discharge: 2023-06-21 | Disposition: A | Discharge status: Home / Self Care | Attending: Emergency Medicine | Admitting: Emergency Medicine

## 2023-06-21 ENCOUNTER — Encounter (HOSPITAL_COMMUNITY): Payer: Self-pay

## 2023-06-21 ENCOUNTER — Emergency Department (HOSPITAL_COMMUNITY)

## 2023-06-21 ENCOUNTER — Other Ambulatory Visit: Payer: Self-pay

## 2023-06-21 DIAGNOSIS — R202 Paresthesia of skin: Secondary | ICD-10-CM | POA: Diagnosis present

## 2023-06-21 DIAGNOSIS — R0789 Other chest pain: Secondary | ICD-10-CM

## 2023-06-21 DIAGNOSIS — I10 Essential (primary) hypertension: Secondary | ICD-10-CM | POA: Diagnosis not present

## 2023-06-21 DIAGNOSIS — Z79899 Other long term (current) drug therapy: Secondary | ICD-10-CM | POA: Insufficient documentation

## 2023-06-21 DIAGNOSIS — R200 Anesthesia of skin: Secondary | ICD-10-CM

## 2023-06-21 LAB — BASIC METABOLIC PANEL
Anion gap: 12 (ref 5–15)
BUN: 22 mg/dL — ABNORMAL HIGH (ref 6–20)
CO2: 27 mmol/L (ref 22–32)
Calcium: 9.5 mg/dL (ref 8.9–10.3)
Chloride: 99 mmol/L (ref 98–111)
Creatinine, Ser: 0.96 mg/dL (ref 0.44–1.00)
GFR, Estimated: >60 mL/min (ref >60)
Glucose, Bld: 114 mg/dL — ABNORMAL HIGH (ref 70–99)
Potassium: 3.7 mmol/L (ref 3.5–5.1)
Sodium: 138 mmol/L (ref 135–145)

## 2023-06-21 LAB — URINALYSIS, ROUTINE W REFLEX MICROSCOPIC
Bilirubin Urine: NEGATIVE
Glucose, UA: NEGATIVE mg/dL
Ketones, ur: NEGATIVE mg/dL
Leukocytes,Ua: NEGATIVE
Nitrite: NEGATIVE
Protein, ur: NEGATIVE mg/dL
Specific Gravity, Urine: 1.02 (ref 1.005–1.030)
pH: 6.5 (ref 5.0–8.0)

## 2023-06-21 LAB — D-DIMER, QUANTITATIVE: D-Dimer, Quant: <0.27 ug{FEU}/mL (ref 0.00–0.50)

## 2023-06-21 LAB — URINALYSIS, MICROSCOPIC (REFLEX)

## 2023-06-21 LAB — TROPONIN I (HIGH SENSITIVITY)
Troponin I (High Sensitivity): 5 ng/L (ref <18)
Troponin I (High Sensitivity): 4 ng/L (ref <18)

## 2023-06-21 LAB — HCG, SERUM, QUALITATIVE: Preg, Serum: NEGATIVE

## 2023-06-21 LAB — CBC
HCT: 41.4 % (ref 36.0–46.0)
Hemoglobin: 13.9 g/dL (ref 12.0–15.0)
MCH: 29.6 pg (ref 26.0–34.0)
MCHC: 33.6 g/dL (ref 30.0–36.0)
MCV: 88.3 fL (ref 80.0–100.0)
Platelets: 333 10*3/uL (ref 150–400)
RBC: 4.69 MIL/uL (ref 3.87–5.11)
RDW: 13.4 % (ref 11.5–15.5)
WBC: 5.9 10*3/uL (ref 4.0–10.5)
nRBC: 0 % (ref 0.0–0.2)

## 2023-06-21 MED ORDER — ALBUTEROL SULFATE HFA 108 (90 BASE) MCG/ACT IN AERS: 1.0000 | INHALATION_SPRAY | RESPIRATORY_TRACT | Status: DC | PRN

## 2023-06-21 MED ORDER — KETOROLAC TROMETHAMINE 30 MG/ML IJ SOLN
30.0000 mg | Freq: Once | INTRAMUSCULAR | Status: AC
  Administered 2023-06-21: 30 mg via INTRAVENOUS
  Filled 2023-06-21: qty 1

## 2023-06-21 MED ORDER — AEROCHAMBER PLUS FLO-VU MEDIUM MISC
1.0000 | Freq: Once | Status: DC
  Filled 2023-06-21: qty 1

## 2023-06-21 NOTE — ED Triage Notes (Signed)
 Pt came in via POV d/t numbness in her Lt leg last night then during the middle of the night she noticed it was in her Lt arm & then this morning she noted some "pulling" in her Lt chest area. She called her PCP & he encouraged her to come into ED for eval. Denies any current CP in triage, does still have some lingering numbness in her Lt leg, does report Hx of a "deteriorating Lt hip."

## 2023-06-21 NOTE — ED Notes (Signed)
 Awaiting patient from lobby.

## 2023-06-21 NOTE — ED Notes (Signed)
 Awaiting aerochamber plus flo-vu from main pharmacy.

## 2023-06-21 NOTE — ED Notes (Signed)
 Patient transported to MRI
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
